# Patient Record
Sex: Female | Born: 1960 | Race: White | Hispanic: No | Marital: Married | Smoking: Never smoker
Health system: Southern US, Community
[De-identification: ages and names within clinical notes are randomized; demographics above are authoritative.]

## PROBLEM LIST (undated history)

## (undated) DIAGNOSIS — I10 Essential (primary) hypertension: Secondary | ICD-10-CM

## (undated) DIAGNOSIS — K219 Gastro-esophageal reflux disease without esophagitis: Secondary | ICD-10-CM

## (undated) DIAGNOSIS — G629 Polyneuropathy, unspecified: Secondary | ICD-10-CM

## (undated) DIAGNOSIS — E785 Hyperlipidemia, unspecified: Secondary | ICD-10-CM

## (undated) DIAGNOSIS — H269 Unspecified cataract: Secondary | ICD-10-CM

## (undated) DIAGNOSIS — R112 Nausea with vomiting, unspecified: Secondary | ICD-10-CM

## (undated) DIAGNOSIS — M1990 Unspecified osteoarthritis, unspecified site: Secondary | ICD-10-CM

## (undated) DIAGNOSIS — T8859XA Other complications of anesthesia, initial encounter: Secondary | ICD-10-CM

## (undated) HISTORY — DX: Gastro-esophageal reflux disease without esophagitis: K21.9

## (undated) HISTORY — DX: Unspecified cataract: H26.9

## (undated) HISTORY — DX: Polyneuropathy, unspecified: G62.9

## (undated) HISTORY — PX: TUBAL LIGATION: SHX77

---

## 1984-12-31 HISTORY — PX: GALLBLADDER SURGERY: SHX652

## 2011-03-26 ENCOUNTER — Emergency Department (HOSPITAL_COMMUNITY): Payer: Self-pay

## 2011-03-26 ENCOUNTER — Inpatient Hospital Stay (HOSPITAL_COMMUNITY)
Admission: EM | Admit: 2011-03-26 | Discharge: 2011-03-29 | DRG: 287 | Disposition: A | Discharge status: Home / Self Care | Payer: Self-pay | Attending: Cardiology | Admitting: Cardiology

## 2011-03-26 ENCOUNTER — Encounter (HOSPITAL_COMMUNITY): Payer: Self-pay

## 2011-03-26 DIAGNOSIS — R0789 Other chest pain: Principal | ICD-10-CM | POA: Diagnosis present

## 2011-03-26 DIAGNOSIS — E669 Obesity, unspecified: Secondary | ICD-10-CM | POA: Diagnosis present

## 2011-03-26 DIAGNOSIS — F411 Generalized anxiety disorder: Secondary | ICD-10-CM | POA: Diagnosis present

## 2011-03-26 DIAGNOSIS — M79609 Pain in unspecified limb: Secondary | ICD-10-CM | POA: Diagnosis present

## 2011-03-26 DIAGNOSIS — I10 Essential (primary) hypertension: Secondary | ICD-10-CM | POA: Diagnosis present

## 2011-03-26 HISTORY — DX: Essential (primary) hypertension: I10

## 2011-03-26 LAB — CBC
HCT: 39.1 % (ref 36.0–46.0)
Hemoglobin: 13.1 g/dL (ref 12.0–15.0)
MCHC: 33.5 g/dL (ref 30.0–36.0)
RDW: 13.3 % (ref 11.5–15.5)
WBC: 6.3 10*3/uL (ref 4.0–10.5)

## 2011-03-26 LAB — DIFFERENTIAL
Basophils Absolute: 0 10*3/uL (ref 0.0–0.1)
Basophils Relative: 0 % (ref 0–1)
Lymphocytes Relative: 47 % — ABNORMAL HIGH (ref 12–46)
Monocytes Absolute: 0.4 10*3/uL (ref 0.1–1.0)
Neutro Abs: 2.8 10*3/uL (ref 1.7–7.7)

## 2011-03-26 LAB — CK TOTAL AND CKMB (NOT AT ARMC)
CK, MB: 1.8 ng/mL (ref 0.3–4.0)
CK, MB: 1.3 ng/mL (ref 0.3–4.0)
Relative Index: 1 (ref 0.0–2.5)
Relative Index: 0.9 (ref 0.0–2.5)
Total CK: 145 U/L (ref 7–177)

## 2011-03-26 LAB — TROPONIN I
Troponin I: 0.01 ng/mL (ref 0.00–0.06)
Troponin I: <0.01 ng/mL (ref 0.00–0.06)

## 2011-03-26 LAB — BASIC METABOLIC PANEL
Calcium: 9.3 mg/dL (ref 8.4–10.5)
GFR calc Af Amer: >60 mL/min (ref >60)
GFR calc non Af Amer: >60 mL/min (ref >60)
Potassium: 4.5 mEq/L (ref 3.5–5.1)
Sodium: 139 mEq/L (ref 135–145)

## 2011-03-26 MED ORDER — IOHEXOL 350 MG/ML SOLN
100.0000 mL | Freq: Once | INTRAVENOUS | Status: AC | PRN
  Administered 2011-03-26: 100 mL via INTRAVENOUS

## 2011-03-28 ENCOUNTER — Inpatient Hospital Stay (HOSPITAL_COMMUNITY): Payer: Self-pay

## 2011-03-28 MED ORDER — TECHNETIUM TC 99M TETROFOSMIN IV KIT
30.0000 | PACK | Freq: Once | INTRAVENOUS | Status: AC | PRN
  Administered 2011-03-27: 30 via INTRAVENOUS

## 2011-03-28 MED ORDER — TECHNETIUM TC 99M TETROFOSMIN IV KIT: 30.0000 | PACK | Freq: Once | INTRAVENOUS | Status: AC | PRN

## 2011-03-29 ENCOUNTER — Other Ambulatory Visit (HOSPITAL_COMMUNITY): Payer: Self-pay

## 2011-03-29 LAB — PROTIME-INR
INR: 0.92 (ref 0.00–1.49)
Prothrombin Time: 12.6 seconds (ref 11.6–15.2)

## 2011-04-20 NOTE — Cardiovascular Report (Signed)
[name], [name]                 ACCOUNT NO.:  [id number]  MEDICAL RECORD NO.:  [id number]           PATIENT TYPE:  LOCATION:                                 FACILITY:  PHYSICIAN:  Lyn Records, M.D.   DATE OF BIRTH:  [birth year]  DATE OF PROCEDURE: DATE OF DISCHARGE:                           CARDIAC CATHETERIZATION   INDICATION FOR STUDY:  The patient has a history of sudden onset chest pain and family history of CAD (mother and father).  The chest pain had feature similar to angina and led to hospitalization.  A subsequent nuclear study after negative markers demonstrated the possibility of apical akinesis and peri-infarct ischemia.  The patient has had no recurrent chest pain since that resolved on the day of admission.  Dr. Mayford Knife has requested catheterization to define the possibility of obstructive coronary artery disease.  PROCEDURES PERFORMED: 1. Left heart catheterization. 2. Selective coronary angiography. 3. Left ventriculography. 4. Initial radial approach aborted due to brachial anatomy that     prevented advancement of the 5-French diagnostic catheter above the     elbow.  There was also significant spasm.  The case was completed     via the right femoral approach.  DESCRIPTION:  After informed consent, the patient was brought to the Catheterization Laboratory.  The right radial approach was chosen as the access site of choice.  A single anterior stick into the radial artery was performed without difficulty and placement of the radial sheath over the straight wire occurred without difficulty.  We attempted to advance a tight J exchange wire, but had resistance slightly above the exit from the catheter.  Angiography demonstrated that the radial artery was small.  There was also tortuosity noted before the brachial artery continued up the arm.  We ultimately used a Glidewire to steer ourselves into the brachial artery.  This was accomplished relatively easily.   We were unable to advance the diagnostic 5-French multipurpose catheter above the level of the elbow due to the small caliber of the vessel (radial) and other anatomic considerations with superimposed spasm.  We did not force this effort and aborted attempts to perform the procedure from the arm and went to her right femoral.  Since given heparin during the initial radial attempts, we used a Smart needle to enter the right femoral with a single anterior stick.  The sheath was placed without difficulty and the procedure performed using a 5-French A2 multipurpose catheter.  200 mcg of intracoronary nitroglycerin was administered into the left coronary artery during diagnostic angiography.  Angio-Seal was used for hemostasis.  The patient tolerated the procedure without complications.  RESULTS: 1. Hemodynamic data:     a.     Aortic pressure 131/64 mmHg.     b.     Left ventricular pressure 134/15 mmHg. 2. Left ventriculography:  The left ventricular cavity size and     function are normal.  EF is 68%. 3. Coronary angiography.     a.     Left main coronary artery:  Normal.     b.     Left anterior descending  coronary artery:  This vessel has      significant diffuse spasm on the initial images that has      significantly reduced after intracoronary nitroglycerin.  No focal      high-grade obstruction is noted within the LAD.  There is moderate      diffuse disease in the apical segment of the LAD.     c.     Circumflex artery:  The circumflex coronary artery is widely      patent.     d.     Right coronary artery:  The right coronary is widely patent.      Right coronary artery is dominant.  CONCLUSION: 1. No significant coronary atherosclerosis is noted.  Luminal     irregularities are noted in the distal LAD and in the right     coronary artery. 2. Normal left ventricular function without evidence of infarction. 3. Normal left ventricular hemodynamics. 4. Aborted right radial  approach due to tortuous anatomy and the     patient's antecubital area.  PLAN:  The patient received Angio-Seal in the right femoral cath site. She did receive 4000 units of heparin.  We will proceed with discharge later this evening if no bleeding or other complications occurred.     Lyn Records, M.D.     HWS/MEDQ  D:  03/29/2011  T:  03/30/2011  Job:  [id number]  cc:   Dr. Mitzi Davenport, M.D.  Electronically Signed by Verdis Prime M.D. on 04/20/2011 04:56:00 PM

## 2011-04-23 NOTE — Consult Note (Signed)
[name], [name]                 ACCOUNT NO.:  [id number]  MEDICAL RECORD NO.:  [id number]           PATIENT TYPE:  LOCATION:                                 FACILITY:  PHYSICIAN:  Armanda Magic, M.D.     DATE OF BIRTH:  07-08-1961  DATE OF CONSULTATION:  03/27/2011 DATE OF DISCHARGE:                                CONSULTATION   REFERRING PHYSICIAN:  [facility].  PRIMARY PHYSICIAN:  Dr. Charm Barges.  CHIEF COMPLAINT:  Chest pain.  HISTORY OF PRESENT ILLNESS:  This is a 50 year old white female with a history of hypertension and obesity who presented to the emergency room with complaints of chest pain.  She states that Saturday a.m. she developed severe pain from her left thigh to her groin.  She took some quinine water.  Throughout the day on Saturday, she became weak and her leg continued to be sore.  Monday while getting ready for work, she developed sharp pain in her left chest with radiation down her left shoulder.  About 15 minutes later, she noted she was very short of breath and then presented to the emergency room.  He was nauseated but no diaphoresis except for during her leg cramps on Saturday.  In the emergency room, she developed tingling and numbness across her chest and up into her left arm and face.  She says she could not talk correctly. In the emergency room, the physician felt she was hyperventilating.  She was given Ativan for anxiety and after 30 minutes, she felt better except for heaviness in her chest.  The pain eventually resolved after 1 hour.  She was also given nitroglycerin with no relief until 45 minutes later.  Her past medical history includes hypertension.  She is currently not on any medications.  PAST SURGICAL HISTORY:  Cholecystectomy in 1983 and tubal ligation in 1980.  SOCIAL HISTORY:  She is married, with 2 children.  She denies any tobacco or alcohol use.  Her medications are none.  FAMILY HISTORY:  Her mother is 43 with  CAD and an MI at 92.  Her father died of an MI at 48.  She has one brother and two sisters, none of which have CAD.  She has one sister with PVD and DVT.  Review of systems is negative except for HPI.  PHYSICAL EXAMINATION:  VITAL SIGNS:  Blood pressure is 141/71, heart rate IS 66, O2 saturations is 98% on room air. GENERAL:  This is a well-developed obese white female in no acute distress. HEENT:  Benign. NECK:  Supple without lymphadenopathy.  Carotid upstrokes are +2 bilaterally.  No bruits. LUNGS:  Clear to auscultation throughout. HEART:  Regular rate and rhythm.  No murmurs, rubs, or gallops. ABDOMEN:  Soft, nontender, nondistended with normoactive bowel sounds. No hepatosplenomegaly. EXTREMITIES:  No cyanosis, erythema, or edema.  Dorsalis pedis pulses +2 bilaterally.  LABORATORY DATA:  Sodium 139, potassium 4.5, chloride 106, bicarb 26, BUN 12, creatinine 0.92, white cell count 6.3, hemoglobin 13.1, hematocrit 39.1, platelet count 193.  CPKs are 174, 134, 145.  MB 1.8, 1.3, 1.3.  Troponin is  less than 0.01 x2 and 0.01.  Chest CT was negative for PE.  EKG shows sinus rhythm with LVH and left anterior fascicular block.  ASSESSMENT: 1. Atypical and typical chest pain with sharp pain along with a     pressure lasting 90 minutes.  Cardiac enzymes are negative x3     despite prolonged chest pain.  Chest CT is negative for PE.  EKG is     nonischemic.  Currently, she is pain-free.  Cardiac risk factors     include obesity and a family history of coronary disease at a later     age in life. 2. Obesity. 3. Chronic lower extremity pain. 4. Elevated D-dimer with negative CT for PE.  PLAN:  The patient was set up for a stress echo in the ER, but she had no IV access so stress echo was cancelled.  She needs a nonischemic workup and would like to go home.  She is self-pay there though and therefore we will keep in the hospital and get a 2-day nuclear stress test.  We will have the  rest images done today and then stress images in the morning.     Armanda Magic, M.D.     TT/MEDQ  D:  04/03/2011  T:  04/04/2011  Job:  [id number]  cc:   Dr. Charm Barges  Electronically Signed by Armanda Magic M.D. on 04/23/2011 07:43:24 PM

## 2011-09-18 IMAGING — CT CT ANGIO CHEST
2 of 6 series · 19 of 36 positions shown · IV contrast (APPLIED)
Comparison: none

CLINICAL DATA: Chest pain, pain radiating down left neck and left
arm with shortness of breath.  Concern pulmonary embolism.

CT ANGIOGRAPHY CHEST WITH CONTRAST
TECHNIQUE: Multidetector CT imaging of the chest was performed
using the standard protocol during bolus administration of
intravenous contrast.  Multiplanar CT image reconstructions
including MIPs were obtained to evaluate the vascular anatomy.
Contrast:  100 ml Omnipaque 300

[Series 8: pulm embolism 1.0 b25f thins · axial · 0.74mm/px · z∈[-242,+4]mm · 18 of 273 slices shown]
[im 14/273  lung]
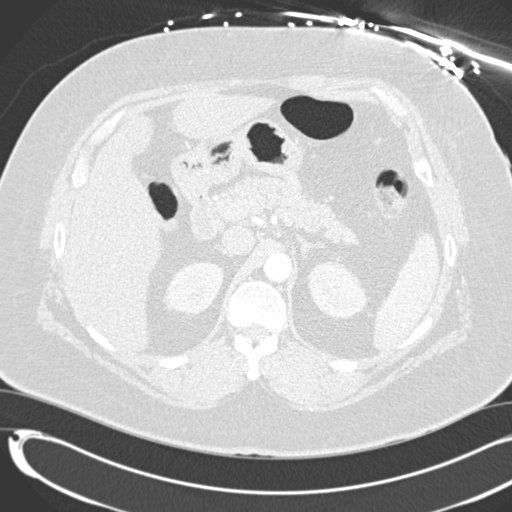
[im 28/273  mediastinal]
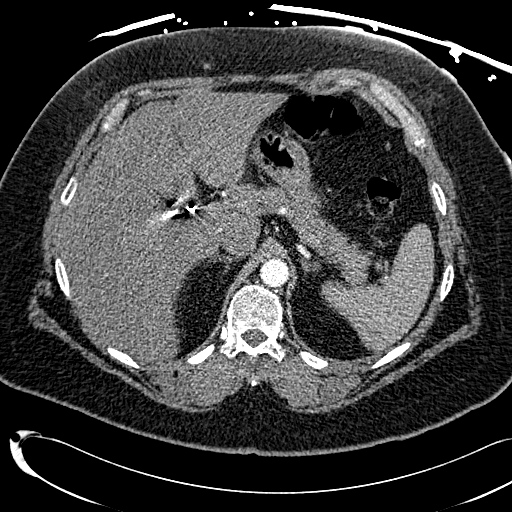
[im 41/273  lung]
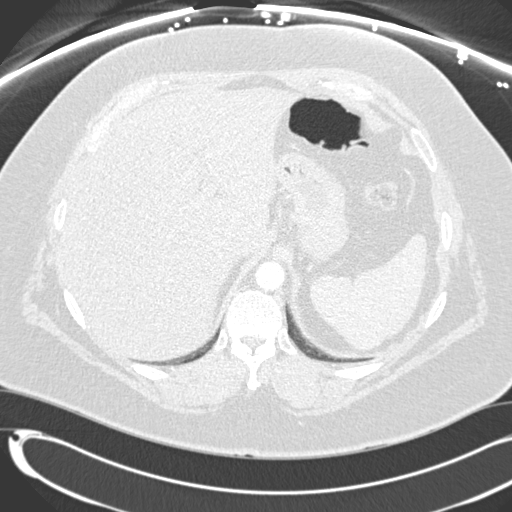
[im 55/273  mediastinal]
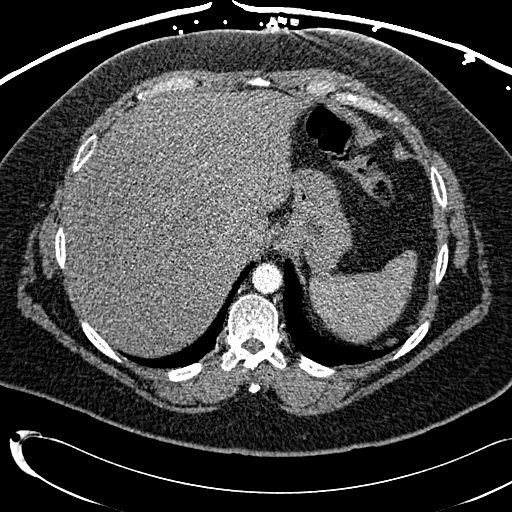
[im 69/273  lung]
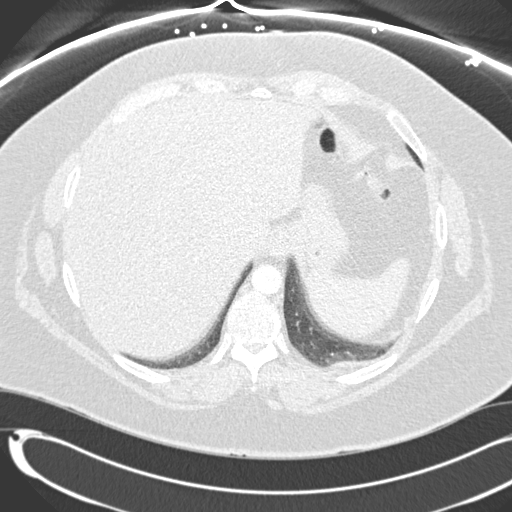
[im 82/273  mediastinal]
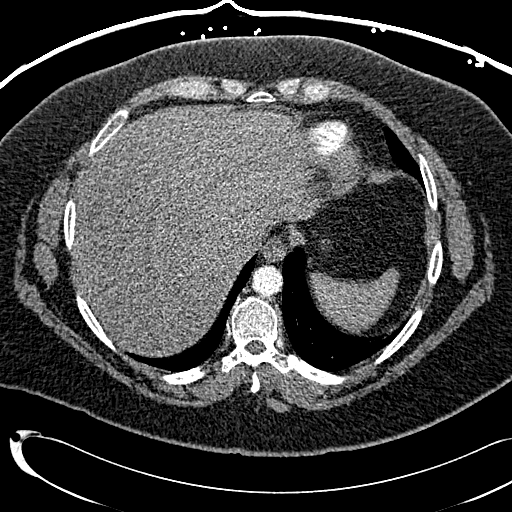
[im 96/273  lung]
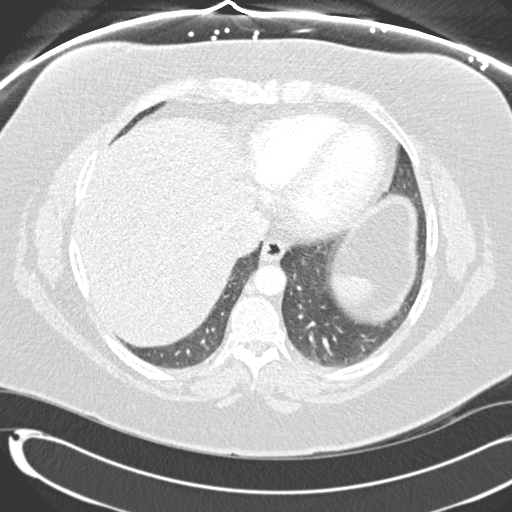
[im 109/273  mediastinal]
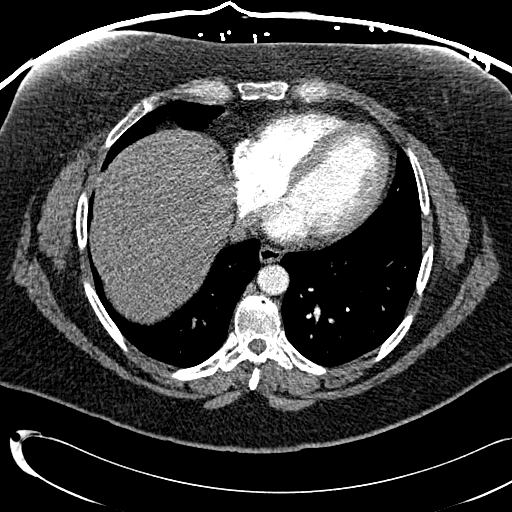
[im 123/273  lung]
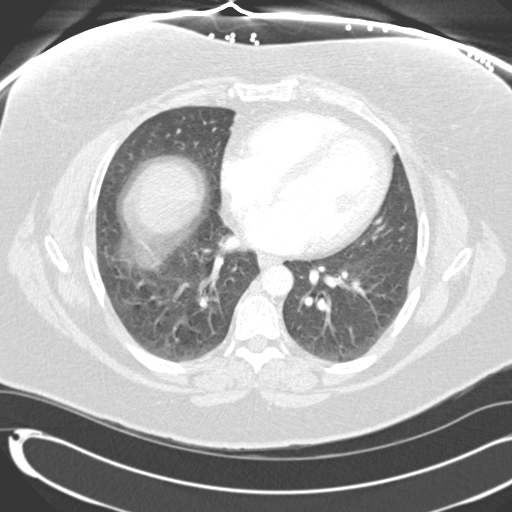
[im 150/273  mediastinal]
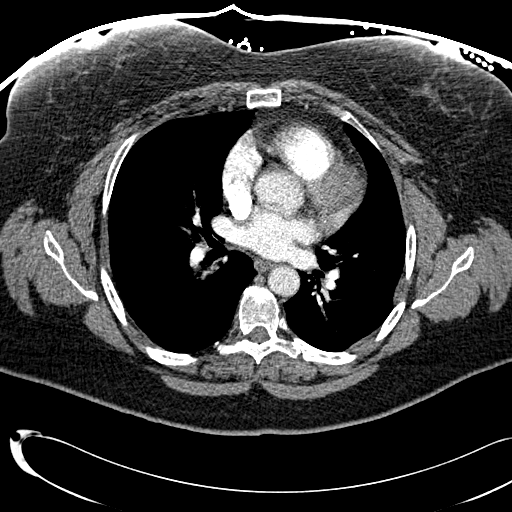
[im 164/273  lung]
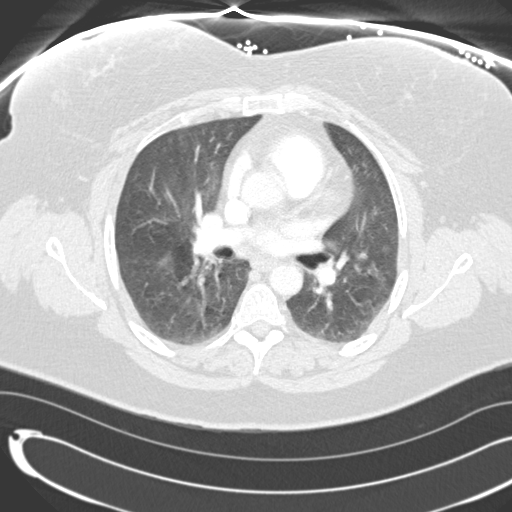
[im 177/273  mediastinal]
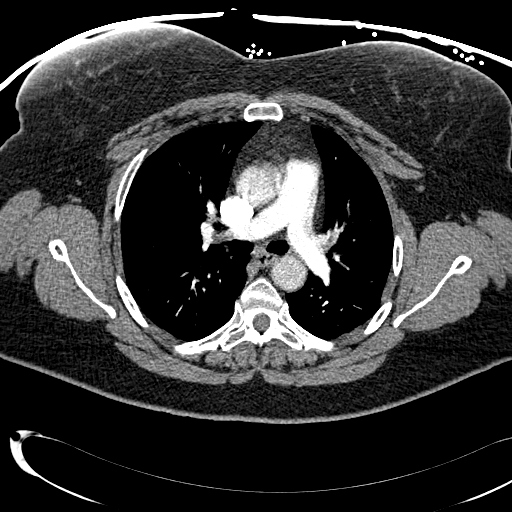
[im 191/273  lung]
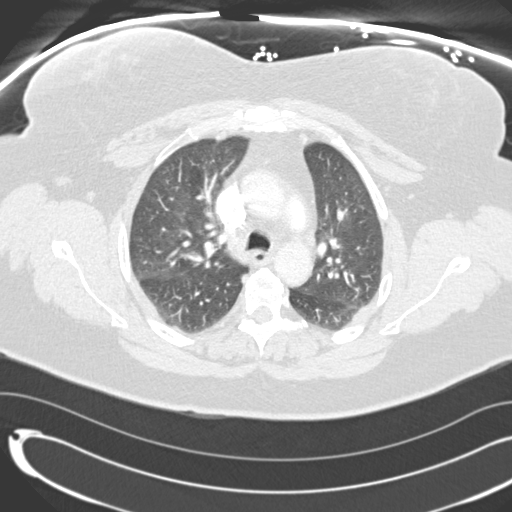
[im 205/273  mediastinal]
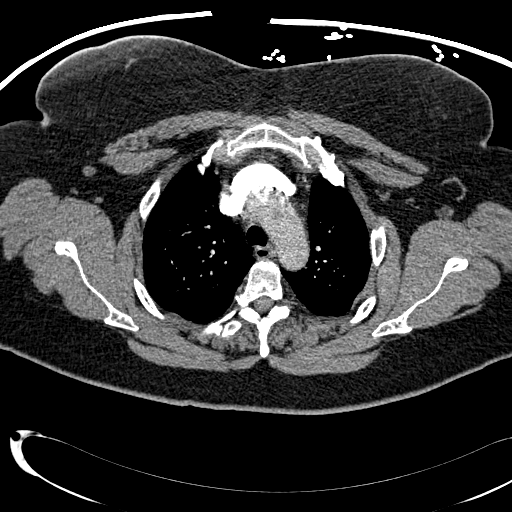
[im 218/273  lung]
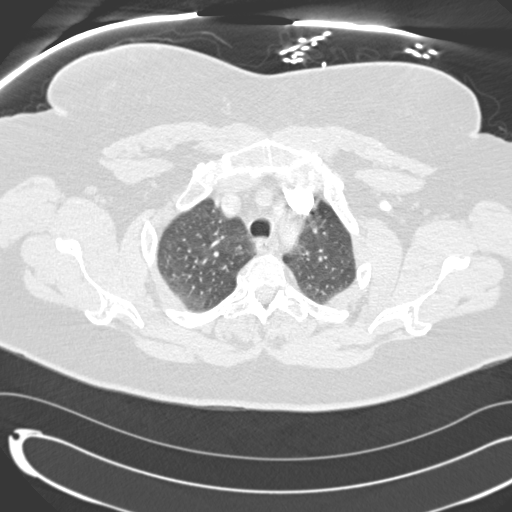
[im 232/273  mediastinal]
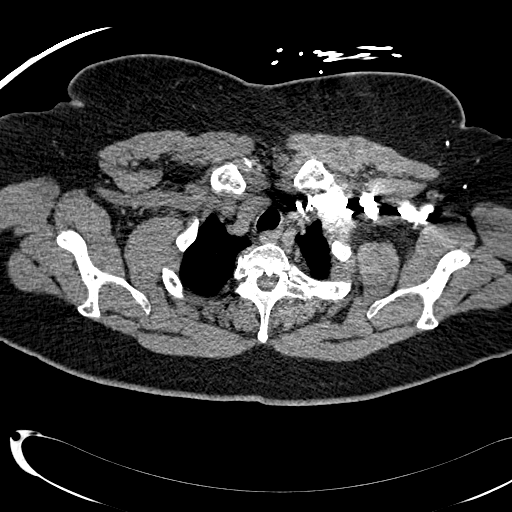
[im 245/273  lung]
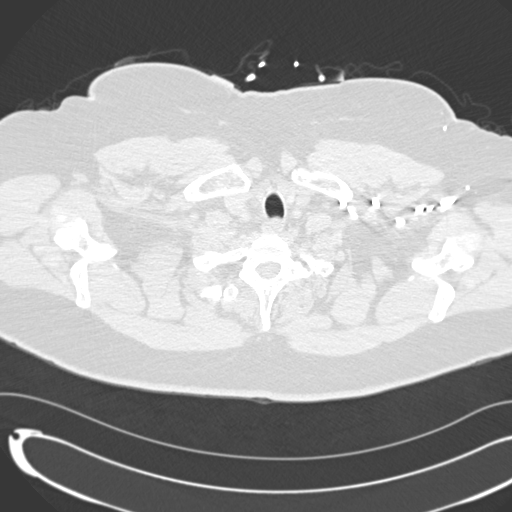
[im 259/273  mediastinal]
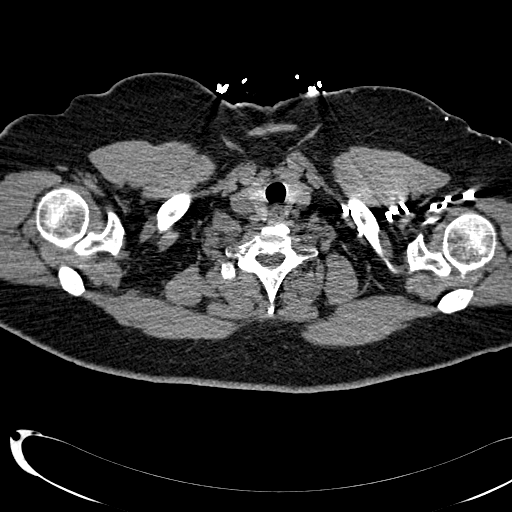

[Series 604: coronal · coronal · 0.74mm/px · 1 of 80 slices shown]
[im 40/80  mediastinal]
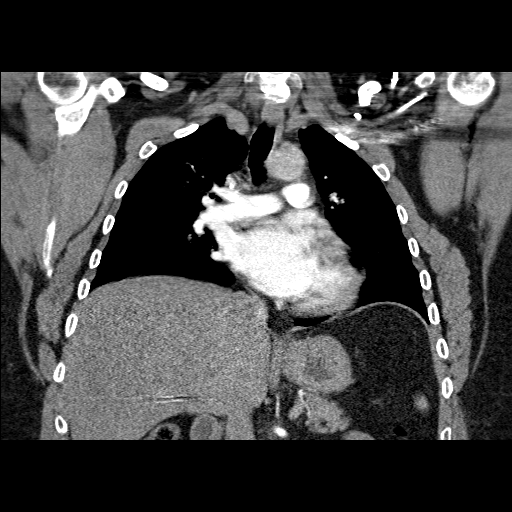

[19 of 36 positions shown; findings below may reference images not displayed]

FINDINGS: No filling defects within the pulmonary arteries to
suggest acute pulmonary embolism.  No acute findings of the aorta
or great vessels.  No pericardial fluid.  The esophagus is normal.

Review of the lung parenchyma demonstrates no effusion,
consolidation, or pneumothorax.  There is respiratory degradation
on the images.  The airways appear normal.

No evidence of axillary supraclavicular lymphadenopathy.  No
mediastinal or hilar lymphadenopathy.

Limited view of the upper abdomen is unremarkable.  There are
cholecystectomy clips in the right upper quadrant.

Review of the MIP images confirms the above findings.
IMPRESSION: 1.  No evidence acute pulmonary embolism.
2.  No acute pulmonary parenchymal abnormality.

## 2013-08-11 ENCOUNTER — Encounter: Payer: Self-pay | Admitting: Diagnostic Neuroimaging

## 2013-08-11 ENCOUNTER — Ambulatory Visit (INDEPENDENT_AMBULATORY_CARE_PROVIDER_SITE_OTHER): Payer: BC Managed Care – PPO | Admitting: Diagnostic Neuroimaging

## 2013-08-11 VITALS — BP 158/86 | HR 76 | Ht 66.0 in | Wt 274.0 lb

## 2013-08-11 DIAGNOSIS — M79606 Pain in leg, unspecified: Secondary | ICD-10-CM | POA: Insufficient documentation

## 2013-08-11 DIAGNOSIS — M79605 Pain in left leg: Secondary | ICD-10-CM

## 2013-08-11 DIAGNOSIS — R200 Anesthesia of skin: Secondary | ICD-10-CM | POA: Insufficient documentation

## 2013-08-11 DIAGNOSIS — M545 Low back pain: Secondary | ICD-10-CM

## 2013-08-11 DIAGNOSIS — M79609 Pain in unspecified limb: Secondary | ICD-10-CM

## 2013-08-11 DIAGNOSIS — R209 Unspecified disturbances of skin sensation: Secondary | ICD-10-CM

## 2013-08-11 MED ORDER — GABAPENTIN 300 MG PO CAPS: 300.0000 mg | ORAL_CAPSULE | Freq: Three times a day (TID) | ORAL | Status: DC

## 2013-08-11 NOTE — Progress Notes (Signed)
[facility]  PATIENT: [name] DOB: 25-Nov-1961  REFERRING CLINICIAN: Nyland HISTORY FROM: patient REASON FOR VISIT: new consult   HISTORICAL  CHIEF COMPLAINT:  Chief Complaint  Patient presents with  . Pain    L leg, cramps, burning, tingling, and numbness    HISTORY OF PRESENT ILLNESS:   52 year old right-handed female here for evaluation of bilateral lower extremity pain.  3-6 months ago patient developed cramps and muscle pain in her left leg greater than right leg, radiating up to her groin. Symptoms were intermittent. She tried over-the-counter muscle cramps and restless leg syndrome medications. These did not help.  Patient also developed burning sensation in her left lower leg, laterally between her ankle and knee. She also developed left heel pain. Symptoms have been increasingly painful over the past 2 months.  Patient also has long-standing low back pain, bilateral hip pain.  Patient also noted as a salmon-colored scaling rash on her left dorsal wrist region, which concerned her for possibility of psoriasis. Patient's brother has psoriasis as a diagnosis. Around the same time patient noticed this rash, her left leg symptoms began to intensify.  REVIEW OF SYSTEMS: Full 14 system review of systems performed and notable only for fevers and chills, fatigue, rash, cramps, numbness, weakness, restless legs.  ALLERGIES: No Known Allergies  HOME MEDICATIONS: Prior to Admission medications   Medication Sig Start Date End Date Taking? Authorizing Provider  omeprazole (PRILOSEC) 40 MG capsule Take 40 mg by mouth daily.   Yes Historical Provider, MD  gabapentin (NEURONTIN) 300 MG capsule Take 1 capsule (300 mg total) by mouth 3 (three) times daily. 08/11/13   Suanne Marker, MD   No outpatient prescriptions prior to visit.   No facility-administered medications prior to visit.    PAST MEDICAL HISTORY: Past Medical History  Diagnosis Date  .  Hypertension   . Acid reflux   . Neuropathy     PAST SURGICAL HISTORY: Past Surgical History  Procedure Laterality Date  . Gallbladder surgery  1986  . Tubal ligation      FAMILY HISTORY: Family History  Problem Relation Age of Onset  . Heart attack Father     SOCIAL HISTORY:  History   Social History  . Marital Status: Married    Spouse Name: Nedra Hai    Number of Children: 2  . Years of Education: College   Occupational History  .  [employer]  .      Unemployed   Social History Main Topics  . Smoking status: Never Smoker   . Smokeless tobacco: Never Used  . Alcohol Use: No  . Drug Use: No  . Sexually Active: Not on file   Other Topics Concern  . Not on file   Social History Narrative   Patient lives at home with spouse.   Caffeine Use: 2 cups daily     PHYSICAL EXAM  Filed Vitals:   08/11/13 0812  BP: 158/86  Pulse: 76  Height: 5\' 6"  (1.676 m)  Weight: 274 lb (124.286 kg)    Not recorded    Body mass index is 44.25 kg/(m^2).  GENERAL EXAM: Patient is in no distress; POSITIVE STRAIGHT LEG RAISE ON LEFT LEG.  CARDIOVASCULAR: Regular rate and rhythm, no murmurs, no carotid bruits  NEUROLOGIC: MENTAL STATUS: awake, alert, language fluent, comprehension intact, naming intact CRANIAL NERVE: no papilledema on fundoscopic exam, pupils equal and reactive to light, visual fields full to confrontation, extraocular muscles intact, no nystagmus, facial sensation and strength  symmetric, uvula midline, shoulder shrug symmetric, tongue midline. MOTOR: normal bulk and tone, full strength in the BUE, BLE SENSORY: normal and symmetric to light touch and proprioception; DECR TO VIB, TEMP, IN FEET. RIGHT TOE VIB 6 SEC, LEFT TOE VIB 9 SEC.  COORDINATION: finger-nose-finger, fine finger movements normal REFLEXES: BUE TRACE, KNEES 1, RIGHT ANKLE 1, LEFT ANKLE 0. DOWN GOING TOES. GAIT/STATION: narrow based gait; PAINFUL IN LEFT HEEL WITH TOE AND  HEEL GAIT. Romberg is  negative   DIAGNOSTIC DATA (LABS, IMAGING, TESTING) - I reviewed patient records, labs, notes, testing and imaging myself where available.  Lab Results  Component Value Date   WBC 6.3 03/26/2011   HGB 13.1 03/26/2011   HCT 39.1 03/26/2011   MCV 85.9 03/26/2011   PLT 193 03/26/2011      Component Value Date/Time   NA 139 03/26/2011 1015   K 4.5 03/26/2011 1015   CL 106 03/26/2011 1015   CO2 26 03/26/2011 1015   GLUCOSE 100* 03/26/2011 1015   BUN 12 03/26/2011 1015   CREATININE 0.92 03/26/2011 1015   CALCIUM 9.3 03/26/2011 1015   GFRNONAA >60 03/26/2011 1015   GFRAA  Value: >60        The eGFR has been calculated using the MDRD equation. This calculation has not been validated in all clinical situations. eGFR's persistently <60 mL/min signify possible Chronic Kidney Disease. 03/26/2011 1015   No results found for this basename: CHOL, HDL, LDLCALC, LDLDIRECT, TRIG, CHOLHDL   No results found for this basename: HGBA1C   No results found for this basename: VITAMINB12   No results found for this basename: TSH     ASSESSMENT AND PLAN  52 y.o. year old female here with 3-6 month history of lower extremity pain, burning, cramps, left worse than right leg. Also with low back pain.  Ddx: Lumbar radiculopathy, peripheral neuropathy, psoriatic arthritis  PLAN: 1. MRI lumbar spine 2. Trial of gabapentin 3. In future may consider physical therapy, EMG nerve conduction, rheumatology evaluation 4. Ask PCP if TSH, B12, A1c have been checked   Orders Placed This Encounter  Procedures  . MR Lumbar Spine Wo Contrast    Meds ordered this encounter  Medications  . omeprazole (PRILOSEC) 40 MG capsule    Sig: Take 40 mg by mouth daily.  Marland Kitchen gabapentin (NEURONTIN) 300 MG capsule    Sig: Take 1 capsule (300 mg total) by mouth 3 (three) times daily.    Dispense:  90 capsule    Refill:  6    Return in about 3 months (around 11/11/2013) for with Heide Guile or Penumalli.    Suanne Marker, MD  08/11/2013, 9:01 AM Certified in Neurology, Neurophysiology and Neuroimaging  [facility] [address], [address] [place], [place] [address] [phone]

## 2013-08-11 NOTE — Patient Instructions (Signed)
Try gabapentin 300 mg at bedtime. May increase to twice a day or 3 times a day as tolerated.  I will check MRI of the lumbar spine (low back).

## 2013-08-19 ENCOUNTER — Other Ambulatory Visit: Payer: BC Managed Care – PPO

## 2013-08-21 ENCOUNTER — Ambulatory Visit: Payer: BC Managed Care – PPO | Admitting: Neurology

## 2013-08-26 ENCOUNTER — Ambulatory Visit (INDEPENDENT_AMBULATORY_CARE_PROVIDER_SITE_OTHER): Payer: BC Managed Care – PPO

## 2013-08-26 DIAGNOSIS — M545 Low back pain: Secondary | ICD-10-CM

## 2013-08-26 DIAGNOSIS — M79605 Pain in left leg: Secondary | ICD-10-CM

## 2013-08-26 DIAGNOSIS — M79609 Pain in unspecified limb: Secondary | ICD-10-CM

## 2013-08-26 DIAGNOSIS — R209 Unspecified disturbances of skin sensation: Secondary | ICD-10-CM

## 2013-08-26 DIAGNOSIS — R200 Anesthesia of skin: Secondary | ICD-10-CM

## 2013-09-01 ENCOUNTER — Telehealth: Payer: Self-pay | Admitting: Diagnostic Neuroimaging

## 2013-09-03 ENCOUNTER — Telehealth: Payer: Self-pay | Admitting: *Deleted

## 2013-09-03 DIAGNOSIS — M545 Low back pain: Secondary | ICD-10-CM

## 2013-09-03 NOTE — Telephone Encounter (Signed)
[phone] please call with MRI results i've already checked and the final result is available. She has been waiting for over a week and is going out of town.

## 2013-09-03 NOTE — Telephone Encounter (Signed)
Patient requesting MRI results

## 2013-09-04 NOTE — Telephone Encounter (Signed)
I called pt. Tried gabapentin --> disoriented. I reviewed MRI results. Offered neurosurg eval vs. pain mgmt clinic vs. PT. She wants to proceed with pain mgmt clinic.    Suanne Marker, MD 09/04/2013, 5:58 PM Certified in Neurology, Neurophysiology and Neuroimaging  [facility] [address] [place], [place] [address] [phone]

## 2013-11-04 NOTE — Telephone Encounter (Signed)
Done

## 2013-11-11 ENCOUNTER — Ambulatory Visit: Payer: BC Managed Care – PPO | Admitting: Nurse Practitioner

## 2020-11-08 ENCOUNTER — Ambulatory Visit (HOSPITAL_COMMUNITY): Payer: Commercial Managed Care - PPO | Admitting: Physical Therapy

## 2020-11-08 ENCOUNTER — Encounter (HOSPITAL_COMMUNITY): Payer: Self-pay

## 2022-01-08 DIAGNOSIS — H35371 Puckering of macula, right eye: Secondary | ICD-10-CM | POA: Diagnosis not present

## 2022-01-08 DIAGNOSIS — H35341 Macular cyst, hole, or pseudohole, right eye: Secondary | ICD-10-CM | POA: Diagnosis not present

## 2022-01-08 DIAGNOSIS — H25813 Combined forms of age-related cataract, bilateral: Secondary | ICD-10-CM | POA: Diagnosis not present

## 2022-01-08 DIAGNOSIS — H53411 Scotoma involving central area, right eye: Secondary | ICD-10-CM | POA: Diagnosis not present

## 2022-01-08 DIAGNOSIS — H5315 Visual distortions of shape and size: Secondary | ICD-10-CM | POA: Diagnosis not present

## 2022-01-08 DIAGNOSIS — H43821 Vitreomacular adhesion, right eye: Secondary | ICD-10-CM | POA: Diagnosis not present

## 2022-01-15 ENCOUNTER — Other Ambulatory Visit: Payer: Self-pay

## 2022-01-15 ENCOUNTER — Encounter (HOSPITAL_COMMUNITY): Payer: Self-pay | Admitting: Ophthalmology

## 2022-01-15 NOTE — Progress Notes (Addendum)
Mrs Gingras denies chest pain or shortness of breath. Patient denies having any s/s of Covid in her household.  Patient denies any known exposure to Covid.   I instructed Mrs. Reddix to stop Mobic today and to not use herbal medication  or take Vitamin until Dr. Allena Katz says it it ok.  I instructed Mrs Golonka to shower with antibiotic soap, if it is available.  Dry off with a clean towel. Do not put lotion, powder, cologne or deodorant or makeup.No jewelry or piercings. Men may shave their face and neck. Woman should not shave. No nail polish, artificial or acrylic nails. Wear clean clothes, brush your teeth. Glasses, contact lens,dentures or partials may not be worn in the OR. If you need to wear them, please bring a case for glasses, do not wear contacts or bring a case, the hospital does not have contact cases, dentures or partials will have to be removed , make sure they are clean, we will provide a denture cup to put them in. You will need some one to drive you home and a responsible person over the age of 68 to stay with you for the first 24 hours after surgery.

## 2022-01-16 ENCOUNTER — Encounter (HOSPITAL_COMMUNITY)
Admission: RE | Disposition: A | Discharge status: Home / Self Care | Payer: Self-pay | Source: Home / Self Care | Attending: Ophthalmology

## 2022-01-16 ENCOUNTER — Ambulatory Visit (HOSPITAL_COMMUNITY): Payer: 59 | Admitting: Certified Registered Nurse Anesthetist

## 2022-01-16 ENCOUNTER — Ambulatory Visit (HOSPITAL_COMMUNITY)
Admission: RE | Admit: 2022-01-16 | Discharge: 2022-01-16 | Disposition: A | Discharge status: Home / Self Care | Payer: 59 | Attending: Ophthalmology | Admitting: Ophthalmology

## 2022-01-16 ENCOUNTER — Encounter (HOSPITAL_COMMUNITY): Payer: Self-pay | Admitting: Ophthalmology

## 2022-01-16 ENCOUNTER — Other Ambulatory Visit: Payer: Self-pay

## 2022-01-16 DIAGNOSIS — I10 Essential (primary) hypertension: Secondary | ICD-10-CM | POA: Diagnosis not present

## 2022-01-16 DIAGNOSIS — Z6841 Body Mass Index (BMI) 40.0 and over, adult: Secondary | ICD-10-CM | POA: Insufficient documentation

## 2022-01-16 DIAGNOSIS — H35341 Macular cyst, hole, or pseudohole, right eye: Secondary | ICD-10-CM | POA: Diagnosis not present

## 2022-01-16 HISTORY — DX: Unspecified osteoarthritis, unspecified site: M19.90

## 2022-01-16 HISTORY — DX: Nausea with vomiting, unspecified: R11.2

## 2022-01-16 HISTORY — PX: PHOTOCOAGULATION WITH LASER: SHX6027

## 2022-01-16 HISTORY — DX: Hyperlipidemia, unspecified: E78.5

## 2022-01-16 HISTORY — DX: Other complications of anesthesia, initial encounter: T88.59XA

## 2022-01-16 HISTORY — PX: GAS/FLUID EXCHANGE: SHX5334

## 2022-01-16 HISTORY — PX: PARS PLANA VITRECTOMY: SHX2166

## 2022-01-16 HISTORY — PX: MEMBRANE PEEL: SHX5967

## 2022-01-16 LAB — BASIC METABOLIC PANEL
Anion gap: 11 (ref 5–15)
BUN: 15 mg/dL (ref 8–23)
CO2: 25 mmol/L (ref 22–32)
Calcium: 8.8 mg/dL — ABNORMAL LOW (ref 8.9–10.3)
Chloride: 105 mmol/L (ref 98–111)
Creatinine, Ser: 1.07 mg/dL — ABNORMAL HIGH (ref 0.44–1.00)
GFR, Estimated: 59 mL/min — ABNORMAL LOW (ref >60)
Glucose, Bld: 97 mg/dL (ref 70–99)
Potassium: 4 mmol/L (ref 3.5–5.1)
Sodium: 141 mmol/L (ref 135–145)

## 2022-01-16 LAB — CBC
HCT: 42.4 % (ref 36.0–46.0)
Hemoglobin: 13.4 g/dL (ref 12.0–15.0)
MCH: 28.7 pg (ref 26.0–34.0)
MCHC: 31.6 g/dL (ref 30.0–36.0)
MCV: 90.8 fL (ref 80.0–100.0)
Platelets: 207 10*3/uL (ref 150–400)
RBC: 4.67 MIL/uL (ref 3.87–5.11)
RDW: 13.2 % (ref 11.5–15.5)
WBC: 7.4 10*3/uL (ref 4.0–10.5)
nRBC: 0 % (ref 0.0–0.2)

## 2022-01-16 SURGERY — PARS PLANA VITRECTOMY WITH 25 GAUGE
Anesthesia: Monitor Anesthesia Care | Site: Eye | Laterality: Right

## 2022-01-16 MED ORDER — KETOROLAC TROMETHAMINE 30 MG/ML IJ SOLN
30.0000 mg | Freq: Once | INTRAMUSCULAR | Status: AC
  Administered 2022-01-16: 30 mg via INTRAVENOUS

## 2022-01-16 MED ORDER — CEFAZOLIN SUBCONJUNCTIVAL INJECTION 100 MG/0.5 ML
INJECTION | SUBCONJUNCTIVAL | Status: DC | PRN
  Administered 2022-01-16: 50 mg via SUBCONJUNCTIVAL

## 2022-01-16 MED ORDER — TOBRAMYCIN-DEXAMETHASONE 0.3-0.1 % OP OINT
TOPICAL_OINTMENT | OPHTHALMIC | Status: DC | PRN
Start: 2022-01-16 — End: 2022-01-16
  Administered 2022-01-16: 1 via OPHTHALMIC

## 2022-01-16 MED ORDER — OXYCODONE HCL 5 MG/5ML PO SOLN: 5.0000 mg | Freq: Once | ORAL | Status: DC | PRN

## 2022-01-16 MED ORDER — AMISULPRIDE (ANTIEMETIC) 5 MG/2ML IV SOLN: 10.0000 mg | Freq: Once | INTRAVENOUS | Status: DC | PRN

## 2022-01-16 MED ORDER — EPINEPHRINE PF 1 MG/ML IJ SOLN
INTRAOCULAR | Status: DC | PRN
  Administered 2022-01-16: 500.03 mL

## 2022-01-16 MED ORDER — NA CHONDROIT SULF-NA HYALURON 40-30 MG/ML IO SOSY
INTRAOCULAR | Status: AC
  Filled 2022-01-16: qty 0.5

## 2022-01-16 MED ORDER — DEXAMETHASONE SODIUM PHOSPHATE 4 MG/ML IJ SOLN
INTRAMUSCULAR | Status: DC | PRN
  Administered 2022-01-16: 8 mg via INTRAVENOUS

## 2022-01-16 MED ORDER — PHENYLEPHRINE HCL 2.5 % OP SOLN
1.0000 [drp] | OPHTHALMIC | Status: AC | PRN
  Administered 2022-01-16 (×3): 1 [drp] via OPHTHALMIC
  Filled 2022-01-16: qty 2

## 2022-01-16 MED ORDER — TRIAMCINOLONE ACETONIDE 40 MG/ML IJ SUSP
INTRAMUSCULAR | Status: AC
  Filled 2022-01-16: qty 5

## 2022-01-16 MED ORDER — CHLORHEXIDINE GLUCONATE 0.12 % MT SOLN
15.0000 mL | Freq: Once | OROMUCOSAL | Status: AC
  Administered 2022-01-16: 15 mL via OROMUCOSAL
  Filled 2022-01-16: qty 15

## 2022-01-16 MED ORDER — PROPARACAINE HCL 0.5 % OP SOLN
1.0000 [drp] | OPHTHALMIC | Status: AC | PRN
  Administered 2022-01-16 (×3): 1 [drp] via OPHTHALMIC
  Filled 2022-01-16: qty 15

## 2022-01-16 MED ORDER — BSS IO SOLN
INTRAOCULAR | Status: AC
  Filled 2022-01-16: qty 15

## 2022-01-16 MED ORDER — DEXMEDETOMIDINE (PRECEDEX) IN NS 20 MCG/5ML (4 MCG/ML) IV SYRINGE
PREFILLED_SYRINGE | INTRAVENOUS | Status: AC
  Filled 2022-01-16: qty 15

## 2022-01-16 MED ORDER — NA CHONDROIT SULF-NA HYALURON 40-30 MG/ML IO SOSY
INTRAOCULAR | Status: DC | PRN
  Administered 2022-01-16: 0.5 mL via INTRAOCULAR

## 2022-01-16 MED ORDER — MIDAZOLAM HCL 2 MG/2ML IJ SOLN
INTRAMUSCULAR | Status: DC | PRN
  Administered 2022-01-16: 1 mg via INTRAVENOUS

## 2022-01-16 MED ORDER — EPINEPHRINE PF 1 MG/ML IJ SOLN
INTRAMUSCULAR | Status: AC
  Filled 2022-01-16: qty 1

## 2022-01-16 MED ORDER — STERILE WATER FOR IRRIGATION IR SOLN
Status: DC | PRN
  Administered 2022-01-16: 1000 mL

## 2022-01-16 MED ORDER — TETRACAINE HCL 0.5 % OP SOLN
OPHTHALMIC | Status: AC
  Filled 2022-01-16: qty 4

## 2022-01-16 MED ORDER — HYALURONIDASE HUMAN 150 UNIT/ML IJ SOLN
INTRAMUSCULAR | Status: AC
  Filled 2022-01-16: qty 1

## 2022-01-16 MED ORDER — LIDOCAINE HCL (PF) 2 % IJ SOLN
INTRAMUSCULAR | Status: AC
  Filled 2022-01-16: qty 10

## 2022-01-16 MED ORDER — BRILLIANT BLUE G 0.025 % IO SOSY
0.5000 mL | PREFILLED_SYRINGE | INTRAOCULAR | Status: DC
  Filled 2022-01-16: qty 0.5

## 2022-01-16 MED ORDER — KETOROLAC TROMETHAMINE 30 MG/ML IJ SOLN
INTRAMUSCULAR | Status: AC
  Filled 2022-01-16: qty 1

## 2022-01-16 MED ORDER — ACETAMINOPHEN 10 MG/ML IV SOLN
1000.0000 mg | Freq: Once | INTRAVENOUS | Status: DC | PRN
  Administered 2022-01-16: 1000 mg via INTRAVENOUS

## 2022-01-16 MED ORDER — OFLOXACIN 0.3 % OP SOLN
1.0000 [drp] | OPHTHALMIC | Status: AC | PRN
  Administered 2022-01-16 (×3): 1 [drp] via OPHTHALMIC
  Filled 2022-01-16: qty 5

## 2022-01-16 MED ORDER — PROPOFOL 10 MG/ML IV BOLUS
INTRAVENOUS | Status: DC | PRN
  Administered 2022-01-16: 50 mg via INTRAVENOUS

## 2022-01-16 MED ORDER — LIDOCAINE HCL 2 % IJ SOLN
INTRAMUSCULAR | Status: DC | PRN
  Administered 2022-01-16: 6 mL via RETROBULBAR

## 2022-01-16 MED ORDER — ACETAMINOPHEN 10 MG/ML IV SOLN
INTRAVENOUS | Status: AC
  Filled 2022-01-16: qty 100

## 2022-01-16 MED ORDER — FENTANYL CITRATE (PF) 100 MCG/2ML IJ SOLN: 25.0000 ug | INTRAMUSCULAR | Status: DC | PRN

## 2022-01-16 MED ORDER — LACTATED RINGERS IV SOLN: INTRAVENOUS | Status: DC

## 2022-01-16 MED ORDER — LIDOCAINE HCL 2 % IJ SOLN
INTRAMUSCULAR | Status: AC
  Filled 2022-01-16: qty 20

## 2022-01-16 MED ORDER — ONDANSETRON HCL 4 MG/2ML IJ SOLN
INTRAMUSCULAR | Status: AC
  Filled 2022-01-16: qty 2

## 2022-01-16 MED ORDER — TROPICAMIDE 1 % OP SOLN
1.0000 [drp] | OPHTHALMIC | Status: AC | PRN
  Administered 2022-01-16 (×3): 1 [drp] via OPHTHALMIC
  Filled 2022-01-16: qty 15

## 2022-01-16 MED ORDER — BUPIVACAINE HCL (PF) 0.75 % IJ SOLN
INTRAMUSCULAR | Status: AC
  Filled 2022-01-16: qty 10

## 2022-01-16 MED ORDER — OXYCODONE HCL 5 MG PO TABS: 5.0000 mg | ORAL_TABLET | Freq: Once | ORAL | Status: DC | PRN

## 2022-01-16 MED ORDER — SODIUM CHLORIDE 0.9 % IV SOLN: INTRAVENOUS | Status: DC | PRN

## 2022-01-16 MED ORDER — CEFAZOLIN SUBCONJUNCTIVAL INJECTION 100 MG/0.5 ML
100.0000 mg | INJECTION | SUBCONJUNCTIVAL | Status: DC
  Filled 2022-01-16: qty 5

## 2022-01-16 MED ORDER — FENTANYL CITRATE (PF) 250 MCG/5ML IJ SOLN
INTRAMUSCULAR | Status: DC | PRN
  Administered 2022-01-16: 50 ug via INTRAVENOUS

## 2022-01-16 MED ORDER — PROMETHAZINE HCL 25 MG/ML IJ SOLN: 6.2500 mg | INTRAMUSCULAR | Status: DC | PRN

## 2022-01-16 MED ORDER — ONDANSETRON HCL 4 MG/2ML IJ SOLN
INTRAMUSCULAR | Status: DC | PRN
  Administered 2022-01-16: 4 mg via INTRAVENOUS

## 2022-01-16 MED ORDER — DEXAMETHASONE SODIUM PHOSPHATE 10 MG/ML IJ SOLN
INTRAMUSCULAR | Status: DC | PRN
  Administered 2022-01-16: 10 mg

## 2022-01-16 MED ORDER — DEXAMETHASONE SODIUM PHOSPHATE 10 MG/ML IJ SOLN
INTRAMUSCULAR | Status: AC
  Filled 2022-01-16: qty 1

## 2022-01-16 MED ORDER — ORAL CARE MOUTH RINSE: 15.0000 mL | Freq: Once | OROMUCOSAL | Status: AC

## 2022-01-16 MED ORDER — BSS PLUS IO SOLN
INTRAOCULAR | Status: AC
  Filled 2022-01-16: qty 500

## 2022-01-16 MED ORDER — TOBRAMYCIN-DEXAMETHASONE 0.3-0.1 % OP OINT
TOPICAL_OINTMENT | OPHTHALMIC | Status: AC
  Filled 2022-01-16: qty 3.5

## 2022-01-16 MED ORDER — BSS IO SOLN
INTRAOCULAR | Status: DC | PRN
  Administered 2022-01-16: 15 mL

## 2022-01-16 MED ORDER — ATROPINE SULFATE 1 % OP SOLN
OPHTHALMIC | Status: AC
  Filled 2022-01-16: qty 5

## 2022-01-16 MED ORDER — MIDAZOLAM HCL 2 MG/2ML IJ SOLN
INTRAMUSCULAR | Status: AC
  Filled 2022-01-16: qty 2

## 2022-01-16 MED ORDER — FENTANYL CITRATE (PF) 250 MCG/5ML IJ SOLN
INTRAMUSCULAR | Status: AC
  Filled 2022-01-16: qty 5

## 2022-01-16 MED ORDER — BRILLIANT BLUE G 0.025 % IO SOSY
PREFILLED_SYRINGE | INTRAOCULAR | Status: DC | PRN
  Administered 2022-01-16: 0.5 mL via INTRAVITREAL

## 2022-01-16 SURGICAL SUPPLY — 42 items
APPLICATOR COTTON TIP 6 STRL (MISCELLANEOUS) ×1 IMPLANT
APPLICATOR COTTON TIP 6IN STRL (MISCELLANEOUS) ×2
BAND WRIST GAS GREEN (MISCELLANEOUS) IMPLANT
CANNULA VLV SOFT TIP 25G (OPHTHALMIC) ×1 IMPLANT
CANNULA VLV SOFT TIP 25GA (OPHTHALMIC) ×2 IMPLANT
CLSR STERI-STRIP ANTIMIC 1/2X4 (GAUZE/BANDAGES/DRESSINGS) ×2 IMPLANT
DRAPE HALF SHEET 40X57 (DRAPES) ×2 IMPLANT
DRAPE RETRACTOR (MISCELLANEOUS) ×2 IMPLANT
FORCEPS GRIESHABER ILM 25G A (INSTRUMENTS) ×1 IMPLANT
GAS AUTO FILL CONSTEL (OPHTHALMIC) ×2
GAS AUTO FILL CONSTELLATION (OPHTHALMIC) IMPLANT
GAS WRIST BAND GREEN (MISCELLANEOUS)
GLOVE SURG SYN 7.5  E (GLOVE) ×1
GLOVE SURG SYN 7.5 E (GLOVE) ×1 IMPLANT
GLOVE SURG SYN 7.5 PF PI (GLOVE) ×1 IMPLANT
GOWN STRL REUS W/ TWL LRG LVL3 (GOWN DISPOSABLE) ×1 IMPLANT
GOWN STRL REUS W/TWL LRG LVL3 (GOWN DISPOSABLE) ×4
KIT BASIN OR (CUSTOM PROCEDURE TRAY) ×2 IMPLANT
KIT TURNOVER KIT B (KITS) ×2 IMPLANT
LENS BIOM SUPER VIEW SET DISP (MISCELLANEOUS) ×2 IMPLANT
NDL 18GX1X1/2 (RX/OR ONLY) (NEEDLE) ×1 IMPLANT
NDL 25GX 5/8IN NON SAFETY (NEEDLE) ×1 IMPLANT
NDL FILTER BLUNT 18X1 1/2 (NEEDLE) ×1 IMPLANT
NDL RETROBULBAR 25GX1.5 (NEEDLE) ×1 IMPLANT
NEEDLE 18GX1X1/2 (RX/OR ONLY) (NEEDLE) ×2 IMPLANT
NEEDLE 25GX 5/8IN NON SAFETY (NEEDLE) ×2 IMPLANT
NEEDLE FILTER BLUNT 18X 1/2SAF (NEEDLE) ×1
NEEDLE FILTER BLUNT 18X1 1/2 (NEEDLE) ×1 IMPLANT
NEEDLE RETROBULBAR 25GX1.5 (NEEDLE) ×2 IMPLANT
NS IRRIG 1000ML POUR BTL (IV SOLUTION) ×2 IMPLANT
PACK VITRECTOMY CUSTOM (CUSTOM PROCEDURE TRAY) ×2 IMPLANT
PAD ARMBOARD 7.5X6 YLW CONV (MISCELLANEOUS) ×3 IMPLANT
PAK PIK VITRECTOMY CVS 25GA (OPHTHALMIC) ×2 IMPLANT
PROBE ENDO DIATHERMY 25G (MISCELLANEOUS) ×1 IMPLANT
PROBE LASER ILLUM FLEX CVD 25G (OPHTHALMIC) ×1 IMPLANT
SCRAPER DIAMOND 25GA (OPHTHALMIC RELATED) ×1 IMPLANT
SOL ANTI FOG 6CC (MISCELLANEOUS) ×1 IMPLANT
SOLUTION ANTI FOG 6CC (MISCELLANEOUS) ×1
SUT VICRYL 7 0 TG140 8 (SUTURE) ×1 IMPLANT
SUT VICRYL 8 0 TG140 8 (SUTURE) IMPLANT
SYR TB 1ML LUER SLIP (SYRINGE) ×1 IMPLANT
WATER STERILE IRR 1000ML POUR (IV SOLUTION) ×2 IMPLANT

## 2022-01-16 NOTE — Brief Op Note (Signed)
01/16/2022  5:01 PM  PATIENT:  [name]  61 y.o. female  PRE-OPERATIVE DIAGNOSIS:  MACULAR HOLE RIGHT EYE  POST-OPERATIVE DIAGNOSIS:   MACULAR HOLE RIGHT EYE  PROCEDURE:  Procedure(s) with comments: PARS PLANA VITRECTOMY WITH 25 GAUGE (Right) INTERNAL LIMITING MEMBRANE PEEL (Right) GAS/FLUID EXCHANGE (Right) - C3F8 PHOTOCOAGULATION WITH LASER (Right)  SURGEON:  Surgeon(s) and Role:    * Jalene Mullet, MD - Primary  PHYSICIAN ASSISTANT:   ASSISTANTS: none   ANESTHESIA:   local and MAC  EBL:  MINIMAL   BLOOD ADMINISTERED:none  DRAINS: none   LOCAL MEDICATIONS USED:  MARCAINE    and LIDOCAINE   SPECIMEN:  No Specimen  DISPOSITION OF SPECIMEN:  N/A  COUNTS:  YES  TOURNIQUET:  * No tourniquets in log *  DICTATION: .Note written in EPIC  PLAN OF CARE: Discharge to home after PACU  PATIENT DISPOSITION:  PACU - hemodynamically stable.   Delay start of Pharmacological VTE agent (>24hrs) due to surgical blood loss or risk of bleeding: not applicable

## 2022-01-16 NOTE — Op Note (Incomplete)
[name] 01/16/2022 Diagnosis: Macular hole right eye  Procedure: Pars Plana Vitrectomy, Membrane Peeling, Endolaser, and Fluid Gas Exchange Operative Eye:  right eye  Surgeon: Harrold Donath Estimated Blood Loss: minimal Specimens for Pathology:  None Complications: none   The  patient was prepped and draped in the usual fashion for ocular surgery on the  right eye .  A lid speculum was placed.  Infusion line and trocar was placed at the 8 o'clock position approximately 3.5 mm from the surgical limbus.   The infusion line was allowed to run and then clamped when placed at the cannula opening. The line was inserted and secured to the drape with an adhesive strip.   Active trocars/cannula were placed at the 10 and 2 o'clock positions approximately 3.5 mm from the surgical limbus. The cannula was visualized in the vitreous cavity.  The light pipe and vitreous cutter were inserted into the vitreous cavity and a core vitrectomy was performed.  Care taken to remove the vitreous up to the vitreous base for 360 degrees.   Membrane blue was injected over the macula and irrigated free after 60 seconds.  The tano scratcher was used to elevate the internal limiting membrane (ILM) and the ILM forceps used to remove the ILM in surrounding the macular hole   Endolaser was applied the temporal tear.  The superior cannulas were sequentially removed with concommitant tamponade using a cotton tipped applicator and noted to be air tight.  The infusion line and trocar were removed and the sclerotomy was noted to be air tight with normal intraocular pressure by digital palpapation.  Subconjunctival injections of  Dexamethasone 4mg /26ml was placed in the infero-medial quadrant.   The speculum and drapes were removed and the eye was patched with Polymixin/Bacitracin ophthalmic ointment. An eye shield was placed and the patient was transferred alert and conversant with stable vital signs to the post operative  recovery area.  The patient tolerated the procedure well and no complications were noted.  0m MD

## 2022-01-16 NOTE — H&P (Signed)
Date of examination:  01/16/2022  Indication for surgery: macula hole right eye  Pertinent past medical history:  Past Medical History:  Diagnosis Date   Acid reflux    Arthritis    Complication of anesthesia    Hyperlipidemia    Hypertension    Neuropathy    PONV (postoperative nausea and vomiting)     Pertinent ocular history:  macular hole right eye  Pertinent family history:  Family History  Problem Relation Age of Onset   Heart attack Father     General:  Healthy appearing patient in no distress.    Eyes:    Acuity OD 20/200  External: Within normal limits     Anterior segment: Within normal limits       Fundus: macular hole right eye   Impression:macular hole right eye  Plan: macular hole repair right eye  Carmela Rima, MD

## 2022-01-16 NOTE — OR Nursing (Signed)
Green Gas bubble arm bracelet applied to patient right wrist

## 2022-01-16 NOTE — Discharge Instructions (Addendum)
DO NOT SLEEP ON BACK, THE EYE PRESSURE CAN GO UP AND CAUSE VISION LOSS   SLEEP ON SIDE WITH NOSE TO PILLOW  DURING DAY KEEP FACE DOWN.  15 MINUTES EVERY 2 HOURS IT IS OK TO LOOK STRAIGHT AHEAD (USE BATHROOM, EAT, WALK, ETC.)  

## 2022-01-16 NOTE — Transfer of Care (Signed)
Immediate Anesthesia Transfer of Care Note  Patient: [name]  Procedure(s) Performed: PARS PLANA VITRECTOMY WITH 25 GAUGE (Right: Eye) MEMBRANE PEEL (Right: Eye) GAS/FLUID EXCHANGE (Right: Eye) PHOTOCOAGULATION WITH LASER (Right: Eye)  Patient Location: PACU  Anesthesia Type:MAC  Level of Consciousness: awake, alert  and oriented  Airway & Oxygen Therapy: Patient Spontanous Breathing  Post-op Assessment: Report given to RN and Post -op Vital signs reviewed and stable  Post vital signs: Reviewed and stable  Last Vitals:  Vitals Value Taken Time  BP 151/83 01/16/22 1709  Temp 36.9 C 01/16/22 1708  Pulse 68 01/16/22 1712  Resp 13 01/16/22 1712  SpO2 97 % 01/16/22 1712  Vitals shown include unvalidated device data.  Last Pain:  Vitals:   01/16/22 1708  PainSc: 0-No pain         Complications: No notable events documented.

## 2022-01-16 NOTE — Anesthesia Preprocedure Evaluation (Addendum)
Anesthesia Evaluation  Patient identified by MRN, date of birth, ID band Patient awake    Reviewed: Allergy & Precautions, NPO status , Patient's Chart, lab work & pertinent test results  Airway Mallampati: II  TM Distance: >3 FB Neck ROM: Full    Dental no notable dental hx.    Pulmonary neg pulmonary ROS,    Pulmonary exam normal breath sounds clear to auscultation       Cardiovascular hypertension, Pt. on medications Normal cardiovascular exam Rhythm:Regular Rate:Normal     Neuro/Psych negative neurological ROS  negative psych ROS   GI/Hepatic Neg liver ROS, GERD  Medicated and Controlled,  Endo/Other  Morbid obesity  Renal/GU negative Renal ROS     Musculoskeletal  (+) Arthritis ,   Abdominal (+) + obese,   Peds  Hematology negative hematology ROS (+)   Anesthesia Other Findings MACULAR CYST, HOLE, OR PSEUDOHOLE RIGHT EYE  Reproductive/Obstetrics                            Anesthesia Physical Anesthesia Plan  ASA: 3  Anesthesia Plan: MAC   Post-op Pain Management:    Induction: Intravenous  PONV Risk Score and Plan: 2 and Ondansetron, Dexamethasone and Treatment may vary due to age or medical condition  Airway Management Planned: Nasal Cannula  Additional Equipment:   Intra-op Plan:   Post-operative Plan:   Informed Consent: I have reviewed the patients History and Physical, chart, labs and discussed the procedure including the risks, benefits and alternatives for the proposed anesthesia with the patient or authorized representative who has indicated his/her understanding and acceptance.     Dental advisory given  Plan Discussed with: CRNA  Anesthesia Plan Comments:        Anesthesia Quick Evaluation

## 2022-01-16 NOTE — Anesthesia Postprocedure Evaluation (Signed)
Anesthesia Post Note  Patient: [name]  Procedure(s) Performed: PARS PLANA VITRECTOMY WITH 25 GAUGE (Right: Eye) MEMBRANE PEEL (Right: Eye) GAS/FLUID EXCHANGE (Right: Eye) PHOTOCOAGULATION WITH LASER (Right: Eye)     Patient location during evaluation: PACU Anesthesia Type: MAC Level of consciousness: awake and alert, awake and oriented Pain management: pain level controlled Vital Signs Assessment: post-procedure vital signs reviewed and stable Respiratory status: spontaneous breathing, nonlabored ventilation and respiratory function stable Cardiovascular status: stable and blood pressure returned to baseline Postop Assessment: no apparent nausea or vomiting Anesthetic complications: no   No notable events documented.  Last Vitals:  Vitals:   01/16/22 1342 01/16/22 1708  BP: (!) 176/71 (!) 151/83  Pulse:  73  Resp:  11  Temp:  36.9 C  SpO2:  99%    Last Pain:  Vitals:   01/16/22 1708  PainSc: 0-No pain                 Santa Lighter

## 2022-01-17 ENCOUNTER — Encounter (HOSPITAL_COMMUNITY): Payer: Self-pay | Admitting: Ophthalmology

## 2022-02-22 DIAGNOSIS — H53411 Scotoma involving central area, right eye: Secondary | ICD-10-CM | POA: Diagnosis not present

## 2022-02-22 DIAGNOSIS — H40052 Ocular hypertension, left eye: Secondary | ICD-10-CM | POA: Diagnosis not present

## 2022-02-22 DIAGNOSIS — H35721 Serous detachment of retinal pigment epithelium, right eye: Secondary | ICD-10-CM | POA: Diagnosis not present

## 2022-02-22 DIAGNOSIS — H35351 Cystoid macular degeneration, right eye: Secondary | ICD-10-CM | POA: Diagnosis not present

## 2022-02-27 NOTE — Op Note (Signed)
[name] 01/16/2022 Diagnosis: Macular hole right eye  Procedure: Pars Plana Vitrectomy, Membrane Peeling, Fluid Gas Exchange, and C3F8 gas tamponade Operative Eye:  right eye  Surgeon: Harrold Donath Estimated Blood Loss: minimal Specimens for Pathology:  None Complications: none   The  patient was prepped and draped in the usual fashion for ocular surgery on the  right eye .  A lid speculum was placed.  Infusion line and trocar was placed at the 8 o'clock position approximately 3.5 mm from the surgical limbus.   The infusion line was allowed to run and then clamped when placed at the cannula opening. The line was inserted and secured to the drape with an adhesive strip.   Active trocars/cannula were placed at the 10 and 2 o'clock positions approximately 3.5 mm from the surgical limbus. The cannula was visualized in the vitreous cavity.  The light pipe and vitreous cutter were inserted into the vitreous cavity and a core vitrectomy was performed.  Care taken to remove the vitreous up to the vitreous base for 360 degrees.  Membrane blue was placed over the macula and irrigated free after 60 seconds.  The Tano scratcher was used to elevate the internal limiting membrane (ILM) and the ILM forceps were used to remove the ILM surrounding the macular hole.   3 rows of endolaser were applied 360 degrees to the periphery.  A complete air-fluid exchange was performed.  14% C3F8 gas was placed in the eye.  The superior cannulas were sequentially removed with concommitant tamponade using a cotton tipped applicator and noted to be air tight.  The infusion line and trocar were removed and the sclerotomy was noted to be air tight with normal intraocular pressure by digital palpapation.  Subconjunctival injections of  Dexamethasone 4mg /57ml was placed in the infero-medial quadrant.   The speculum and drapes were removed and the eye was patched with Polymixin/Bacitracin ophthalmic ointment. An eye  shield was placed and the patient was transferred alert and conversant with stable vital signs to the post operative recovery area.  The patient tolerated the procedure well and no complications were noted.  0m MD

## 2022-04-05 DIAGNOSIS — H35411 Lattice degeneration of retina, right eye: Secondary | ICD-10-CM | POA: Diagnosis not present

## 2022-04-05 DIAGNOSIS — H35721 Serous detachment of retinal pigment epithelium, right eye: Secondary | ICD-10-CM | POA: Diagnosis not present

## 2022-04-05 DIAGNOSIS — H35371 Puckering of macula, right eye: Secondary | ICD-10-CM | POA: Diagnosis not present

## 2022-04-05 DIAGNOSIS — H53411 Scotoma involving central area, right eye: Secondary | ICD-10-CM | POA: Diagnosis not present

## 2022-04-05 DIAGNOSIS — H33321 Round hole, right eye: Secondary | ICD-10-CM | POA: Diagnosis not present

## 2022-05-10 DIAGNOSIS — H35411 Lattice degeneration of retina, right eye: Secondary | ICD-10-CM | POA: Diagnosis not present

## 2022-05-10 DIAGNOSIS — H21341 Primary cyst of pars plana, right eye: Secondary | ICD-10-CM | POA: Diagnosis not present

## 2022-05-10 DIAGNOSIS — H33321 Round hole, right eye: Secondary | ICD-10-CM | POA: Diagnosis not present

## 2022-05-10 DIAGNOSIS — H25813 Combined forms of age-related cataract, bilateral: Secondary | ICD-10-CM | POA: Diagnosis not present

## 2022-05-10 DIAGNOSIS — H35721 Serous detachment of retinal pigment epithelium, right eye: Secondary | ICD-10-CM | POA: Diagnosis not present

## 2022-05-10 DIAGNOSIS — H35341 Macular cyst, hole, or pseudohole, right eye: Secondary | ICD-10-CM | POA: Diagnosis not present

## 2022-05-24 ENCOUNTER — Other Ambulatory Visit: Payer: Self-pay | Admitting: Physician Assistant

## 2022-05-24 ENCOUNTER — Other Ambulatory Visit (HOSPITAL_COMMUNITY): Payer: Self-pay | Admitting: Physician Assistant

## 2022-05-24 DIAGNOSIS — M2550 Pain in unspecified joint: Secondary | ICD-10-CM | POA: Diagnosis not present

## 2022-05-24 DIAGNOSIS — I10 Essential (primary) hypertension: Secondary | ICD-10-CM | POA: Diagnosis not present

## 2022-05-24 DIAGNOSIS — M79604 Pain in right leg: Secondary | ICD-10-CM

## 2022-05-24 DIAGNOSIS — Z6841 Body Mass Index (BMI) 40.0 and over, adult: Secondary | ICD-10-CM | POA: Diagnosis not present

## 2022-05-30 ENCOUNTER — Ambulatory Visit (HOSPITAL_COMMUNITY)
Admission: RE | Admit: 2022-05-30 | Discharge: 2022-05-30 | Disposition: A | Discharge status: Home / Self Care | Payer: 59 | Source: Ambulatory Visit | Attending: Physician Assistant | Admitting: Physician Assistant

## 2022-05-30 DIAGNOSIS — M79604 Pain in right leg: Secondary | ICD-10-CM | POA: Insufficient documentation

## 2022-05-30 DIAGNOSIS — M79661 Pain in right lower leg: Secondary | ICD-10-CM | POA: Diagnosis not present

## 2022-05-30 DIAGNOSIS — M79605 Pain in left leg: Secondary | ICD-10-CM | POA: Diagnosis not present

## 2022-05-30 DIAGNOSIS — M79662 Pain in left lower leg: Secondary | ICD-10-CM | POA: Diagnosis not present

## 2022-06-06 ENCOUNTER — Encounter: Payer: Self-pay | Admitting: Neurology

## 2022-06-11 DIAGNOSIS — M1711 Unilateral primary osteoarthritis, right knee: Secondary | ICD-10-CM | POA: Diagnosis not present

## 2022-06-11 DIAGNOSIS — M25561 Pain in right knee: Secondary | ICD-10-CM | POA: Diagnosis not present

## 2022-07-11 DIAGNOSIS — H33321 Round hole, right eye: Secondary | ICD-10-CM | POA: Diagnosis not present

## 2022-07-23 DIAGNOSIS — E7849 Other hyperlipidemia: Secondary | ICD-10-CM | POA: Diagnosis not present

## 2022-07-23 DIAGNOSIS — E782 Mixed hyperlipidemia: Secondary | ICD-10-CM | POA: Diagnosis not present

## 2022-07-27 DIAGNOSIS — M1711 Unilateral primary osteoarthritis, right knee: Secondary | ICD-10-CM | POA: Diagnosis not present

## 2022-07-27 DIAGNOSIS — M25562 Pain in left knee: Secondary | ICD-10-CM | POA: Diagnosis not present

## 2022-08-07 DIAGNOSIS — Z1212 Encounter for screening for malignant neoplasm of rectum: Secondary | ICD-10-CM | POA: Diagnosis not present

## 2022-08-07 DIAGNOSIS — Z1211 Encounter for screening for malignant neoplasm of colon: Secondary | ICD-10-CM | POA: Diagnosis not present

## 2022-08-24 DIAGNOSIS — H35341 Macular cyst, hole, or pseudohole, right eye: Secondary | ICD-10-CM | POA: Diagnosis not present

## 2022-08-24 DIAGNOSIS — H21341 Primary cyst of pars plana, right eye: Secondary | ICD-10-CM | POA: Diagnosis not present

## 2022-08-24 DIAGNOSIS — H33321 Round hole, right eye: Secondary | ICD-10-CM | POA: Diagnosis not present

## 2022-08-24 DIAGNOSIS — H31091 Other chorioretinal scars, right eye: Secondary | ICD-10-CM | POA: Diagnosis not present

## 2022-08-24 DIAGNOSIS — H25811 Combined forms of age-related cataract, right eye: Secondary | ICD-10-CM | POA: Diagnosis not present

## 2022-08-31 DIAGNOSIS — M170 Bilateral primary osteoarthritis of knee: Secondary | ICD-10-CM | POA: Diagnosis not present

## 2022-09-04 NOTE — Progress Notes (Unsigned)
Initial neurology clinic note  SERVICE DATE: 09/05/22 SERVICE TIME: 9:30 am  Reason for Evaluation: Consultation requested by Lovey Newcomer, PA for an opinion regarding leg pain. My final recommendations will be communicated back to the requesting physician by way of shared medical record or letter to requesting physician via [place] mail.  HPI: This is [name], a 60 y.o. 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 The patient is accompanied by husband.  Patient started having cramps in her legs about 10 years ago. She saw her physician who told her she was having spasms. She would eat mustard and this would help. They will be from the legs down. They seldom go above the knee. Over the years, the cramps have gotten more intense and frequent. She has burning, itching, tightness, and get very cold, particularly at night. It prevents her from sleeping. Some days are better than others. It is always at a least a 4/10 and as bad as 9/10. After the spasms, she feels very weak, as if she ran a marathon. She denies falls and does not walk with assistive devices. Symptoms seem worse in her left leg compared to the right.  She also endorses cramps in her hands with tightness in the hands in the morning (which she attributes to arthritis). She has occasional numbness and tingling in the hands that come and go.   Patient was seen by Daria Pastures on 05/24/22 for bilateral foot pain which she described as tingling. In documentation, there was noted of a minimally elevated anti-CCP antibody.  Of note, patient was seen by [facility] (Dr. Marjory Lies) in 2014 for left leg burning, tingling, numbness, and cramps. 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 Patient was then referred to pain  management as gabapentin caused disorientation and had to stop. Patient also declined PT at that time. She ended up having surgery in 2017 at L4-5 when she states the disc was removed. Patient is going to ortho for OA. She may be having silicone but in her knees.  Patient saw rheumatology (outside our system, maybe in 2020, she does not remember the doctor's name) to evaluate for rheumatoid arthritis, which she was told she did not have. She may have had genetic testing which she was told that showed something that ran in her family.  The patient denies symptoms suggestive of oculobulbar weakness including diplopia, ptosis, dysphagia, poor saliva control, dysarthria/dysphonia, impaired mastication, facial weakness/droop.  There are no neuromuscular respiratory weakness symptoms, particularly orthopnea>dyspnea.   The patient does not report symptoms referable to autonomic dysfunction including impaired sweating, heat or cold intolerance, excessive mucosal dryness, gastroparetic early satiety, postprandial abdominal bloating, constipation, bowel or bladder dyscontrol, or syncope/presyncope/orthostatic intolerance.  The patient has not noticed any recent skin rashes nor does she report any constitutional symptoms like fever, night sweats, anorexia or unintentional weight loss.  EtOH use: None  Restrictive diet? No Family history of neuropathy/myopathy/NM disease? Mother has neuropathy (unknown cause), maternal grandfather had neuropathy, father had muscle cramping, sister with neuropathy  She has never had an EMG.  Current medications being tried for the patient's symptoms: none  Prior medications that have been tried: gabapentin (stopped because inside of mouth swelled up)  Of note, patient mentions vertigo and hearing loss a couple of years ago  that required hospitalization. This has resolved after learning exercises (sounds like BPPV per her description).  Patient thinks she has nerve damage  from her back, but also thinks there is something hereditary (muscle disorder). She mentions that her relatives walk fun and are in pain.   MEDICATIONS:  Outpatient Encounter Medications as of 09/05/2022  Medication Sig   amLODipine (NORVASC) 5 MG tablet Take 5 mg by mouth daily.   Biotin w/ Vitamins C & E (HAIR/SKIN/NAILS PO) Take 1 tablet by mouth daily.   DULoxetine (CYMBALTA) 30 MG capsule Take 1 capsule (30 mg total) by mouth at bedtime for 7 days, THEN 2 capsules (60 mg total) at bedtime.   losartan (COZAAR) 100 MG tablet Take 100 mg by mouth daily.   meloxicam (MOBIC) 15 MG tablet Take 15 mg by mouth daily.   omeprazole (PRILOSEC) 40 MG capsule Take 40 mg by mouth daily.   OVER THE COUNTER MEDICATION Place 1 application into both ears daily as needed (Motion Ease).   RABEprazole (ACIPHEX) 20 MG tablet Take 20 mg by mouth daily.   No facility-administered encounter medications on file as of 09/05/2022.    PAST MEDICAL HISTORY: Past Medical History:  Diagnosis Date   Acid reflux    Arthritis    Cataract    Complication of anesthesia    Hyperlipidemia    Hypertension    Neuropathy    PONV (postoperative nausea and vomiting)     PAST SURGICAL HISTORY: Past Surgical History:  Procedure Laterality Date   GALLBLADDER SURGERY  1986   GAS/FLUID EXCHANGE Right 01/16/2022   Procedure: GAS/FLUID EXCHANGE;  Surgeon: Carmela Rima, MD;  Location: [facility] OR;  Service: Ophthalmology;  Laterality: Right;  C3F8   MEMBRANE PEEL Right 01/16/2022   Procedure: MEMBRANE PEEL;  Surgeon: Carmela Rima, MD;  Location: [facility] OR;  Service: Ophthalmology;  Laterality: Right;   PARS PLANA VITRECTOMY Right 01/16/2022   Procedure: PARS PLANA VITRECTOMY WITH 25 GAUGE;  Surgeon: Carmela Rima, MD;  Location: [facility] OR;  Service: Ophthalmology;  Laterality: Right;   PHOTOCOAGULATION WITH LASER Right 01/16/2022   Procedure: PHOTOCOAGULATION WITH LASER;  Surgeon: Carmela Rima, MD;  Location: [facility] OR;  Service:  Ophthalmology;  Laterality: Right;   TUBAL LIGATION      ALLERGIES: Allergies  Allergen Reactions   Gabapentin Hives and Swelling    Whole body    FAMILY HISTORY: Family History  Problem Relation Age of Onset   Heart attack Mother    Neuropathy Mother    Arthritis Mother    Heart attack Father     SOCIAL HISTORY: Social History   Tobacco Use   Smoking status: Never   Smokeless tobacco: Never  Vaping Use   Vaping Use: Never used  Substance Use Topics   Alcohol use: Never   Drug use: No   Social History   Social History Narrative   Patient lives at home with spouse.Caffeine Use: 2 cups daily      Right Handed    Lives in a one story home     OBJECTIVE: PHYSICAL EXAM: BP (!) 170/79   Pulse 72   Ht 5\' 5"  (1.651 m)   Wt 260 lb (117.9 kg)   SpO2 97%   BMI 43.27 kg/m   General: General appearance: Awake and alert. No distress. Cooperative with exam.  Skin: No obvious rash or jaundice. HEENT: Atraumatic. Anicteric. Lungs: Non-labored breathing on room air  Psych: Affect appropriate.  Neurological: Mental Status: Alert. Speech fluent. No pseudobulbar  affect Cranial Nerves: CNII: No RAPD. CNIII, IV, VI: PERRL. No nystagmus. EOMI. CN V: Facial sensation intact bilaterally to fine touch. CN VII: Facial muscles symmetric and strong. No ptosis at rest. CN VIII: Hears finger rub well bilaterally. CN IX: No hypophonia. CN X: Palate elevates symmetrically. CN XI: Full strength shoulder shrug bilaterally. CN XII: Tongue protrusion full and midline. No atrophy or fasciculations. No significant dysarthria Motor: Tone is normal. No fasciculations in extremities. No atrophy. No grip or percussive myotonia.  Individual muscle group testing (MRC grade out of 5):  Movement     Neck flexion 5    Neck extension 5     Right Left   Shoulder abduction 5 5   Shoulder adduction 5 5   Shoulder ext rotation 5 5   Shoulder int rotation 5 5   Elbow flexion 5 5    Elbow extension 5 5   Wrist extension 5 5   Wrist flexion 5 5   Finger abduction - FDI 5 5   Finger abduction - ADM 5 5   Finger extension 5 5   Finger distal flexion - 2/3 5 5    Finger distal flexion - 4/5 5 5    Thumb flexion - FPL 5 5   Thumb abduction - APB 5 5    Hip flexion 5 5   Hip extension 5 5   Hip adduction 5 5   Hip abduction 5 5   Knee extension 5 5   Knee flexion 5 5   Dorsiflexion 5 5   Plantarflexion 5 5   Inversion 5 5   Eversion 5 5   Great toe extension 5 5   Great toe flexion 5 5     Reflexes:  Right Left   Bicep 2+ 2+   Tricep 2+ 2+   BrRad 2+ 2+   Knee 2+ 2+   Ankle 2+ 0    Pathological Reflexes: Babinski: mute response bilaterally Hoffman: Absent in RUE, present in LUE Troemner: absent bilaterally  Sensation: Pinprick: Intact except, 50% of normal in left lateral leg and foot Vibration: 14 seconds in right great toe, 5 seconds in left great toe Temperature: Intact except, lateral aspect of left calf and foot 25% of normal Proprioception: intact in bilateral great toes Coordination: Intact finger-to- nose-finger bilaterally. Romberg negative. Gait: Able to rise from chair with arms crossed unassisted. Normal, narrow-based gait. Able to tandem walk. Able to walk on toes. Difficulty with heel walk on left.  Lab and Test Review: Internal labs: 01/16/22: BMP and CBC unremarkable  Bilateral lower extremity US (05/30/22): Normal bilateral resting ankle-brachial indices  MRI lumbar spine (08/26/13): FINDINGS:  On sagittal views the vertebral bodies have normal height and alignment,  except for 5mm retrolisthesis of L4 on L5 with posterior disc bulging.    Multi-level facet hypertrophy from L1-2 down to L5-S1. Degenerative  endplate disease at L5-S1 with fatty degeneration. The conus medullaris  terminates at the level of L1-2   On axial views:  L1-2: no spinal stenosis or foraminal narrowing  L2-3: no spinal stenosis or foraminal narrowing   L3-4: no spinal stenosis or foraminal narrowing  L4-5: no spinal stenosis or foraminal narrowing  L5-S1: disc bulging, facet hypertrophy with moderate-severe biforaminal  foraminal stenosis and bilateral lateral recess stenosis; potential  impingement upon the bilateral L5 and S1 roots   Limited views of the aorta, kidneys, iliopsoas muscles and sacroiliac  joints are unremarkable.     Impression  Abnormal MRI  lumbar spine (without) demonstrating:  1. At L5-S1: 71mm retrolisthesis of L4 on L5 with posterior disc bulging,  facet hypertrophy, resulting in moderate-severe biforaminal foraminal  stenosis and bilateral lateral recess stenosis, and potential impingement  upon the bilateral L5 and S1 roots  2. Multi-level facet hypertrophy from L1-2 down to L5-S1.     ASSESSMENT: [name] is a 61 y.o. female who presents for evaluation of leg burning and cramping. 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 Her neurological examination is pertinent for intact strength but asymmetric ankle jerks (absent on left) and sensory deficit on lateral left calf and foot. Available diagnostic data is significant for MRI lumbar spine from 2014 showing stenosis with moderate to severe foraminal stenosis with potential impingement at bilateral L5 and S1 nerve roots.   Overall, patient's symptoms of burning and cramps are neuropathic in origin. Given her asymmetric reflexes and sensory exam, her previous lumbar radiculopathy is certainly one component. It is also possible that she has a distal symmetric polyneuropathy, perhaps small fiber neuropathy. I will look for treatable causes of cramps and neuropathy and obtain an EMG with management as below.  PLAN: -Blood work: CK, B12, HbA1c, IFE, SPEP, ionized calcium, PTH, vit D, B6 -EMG: LLE > RLE (likely S1 radiculopathy but perhaps overlapping PN) -Given  over the counter recommendations for cramps and neuropathic pain. Patient will continue lidocaine cream as needed -Start Cymbalta 30 mg at night for 1 week, then 60 mg thereafter  -Return to clinic in 3 months  The impression above as well as the plan as outlined below were extensively discussed with the patient (in the company of husband) who voiced understanding. All questions were answered to their satisfaction.  The patient was counseled on pertinent fall precautions per the printed material provided today, and as noted under the "Patient Instructions" section below.  When available, results of the above investigations and possible further recommendations will be communicated to the patient via telephone/MyChart. Patient to call office if not contacted after expected testing turnaround time.   Total time spent reviewing records, interview, history/exam, documentation, and coordination of care on day of encounter:  65 min   Thank you for allowing me to participate in patient's care.  If I can answer any additional questions, I would be pleased to do so.  Jacquelyne Balint, MD   CC: Lovey Newcomer, PA [address] [place] [address]  CC: Referring provider: Lovey Newcomer, PA [address] [place],  [place] [address]

## 2022-09-05 ENCOUNTER — Ambulatory Visit (INDEPENDENT_AMBULATORY_CARE_PROVIDER_SITE_OTHER): Payer: 59 | Admitting: Neurology

## 2022-09-05 ENCOUNTER — Encounter: Payer: Self-pay | Admitting: Neurology

## 2022-09-05 ENCOUNTER — Other Ambulatory Visit (INDEPENDENT_AMBULATORY_CARE_PROVIDER_SITE_OTHER): Payer: 59

## 2022-09-05 VITALS — BP 170/79 | HR 72 | Ht 65.0 in | Wt 260.0 lb

## 2022-09-05 DIAGNOSIS — Z131 Encounter for screening for diabetes mellitus: Secondary | ICD-10-CM | POA: Diagnosis not present

## 2022-09-05 DIAGNOSIS — G629 Polyneuropathy, unspecified: Secondary | ICD-10-CM

## 2022-09-05 DIAGNOSIS — M5416 Radiculopathy, lumbar region: Secondary | ICD-10-CM | POA: Diagnosis not present

## 2022-09-05 DIAGNOSIS — R209 Unspecified disturbances of skin sensation: Secondary | ICD-10-CM

## 2022-09-05 LAB — VITAMIN D 25 HYDROXY (VIT D DEFICIENCY, FRACTURES): VITD: 16.53 ng/mL — ABNORMAL LOW (ref 30.00–100.00)

## 2022-09-05 LAB — VITAMIN B12: Vitamin B-12: 155 pg/mL — ABNORMAL LOW (ref 211–911)

## 2022-09-05 LAB — HEMOGLOBIN A1C: Hgb A1c MFr Bld: 6.1 % (ref 4.6–6.5)

## 2022-09-05 MED ORDER — DULOXETINE HCL 30 MG PO CPEP: ORAL_CAPSULE | ORAL | 0 refills | Status: DC

## 2022-09-05 NOTE — Patient Instructions (Addendum)
I think your cramping and burning pain in the legs is from nerve damage, likely from your back, but may also be a neuropathy.   I would like to do lab work tonight to look for potential causes.  I would like to do a muscle and nerve test called an EMG to make sure I understand the cause of your symptoms.  Treatment: -Will prescribe Cymbalta for nerve pain. You will take 1 tablet (30 mg) at bedtime for one week. If you are not having side effects, then increase to 2 tablets (60 mg) at bedtime.   I will be in contact when I have your results and want to see you in clinic again in 3 months.  Please let me know if you have any questions or concerns in the meantime.   - Recommend the following measures that may provide some symptomatic benefit for muscle twitching and/or cramps: - Adequate oral clear fluid intake to maintain optimal hydration (about 2.5 liters, or around 8-10 glasses per day) Avoidance of caffeine Trial of DIET tonic water: About 1 glass, up to 6 times daily Magnesium oxide up to 400 mg by mouth twice daily, as needed (over the counter) Gentle muscle stretching routine, especially before bedtime  Supplements that can be tried for "nerve" type pain: Alpha lipoic acid 641m daily: Has some research data but actual dose not well established as they used IV in the clinical research trials. No major side effects other than <1% of people report upset stomach. This can be taken twice per day (12064mdaily) if no relief obtained.  Acetyl-L-carnitine 100040m times daily: this has the most research data backing it with reports of diabetic, HIV and chemotherapy related neuropathy patients reporting improved symptoms. Well tolerated overall but can cause GI upset so take it with food.  Fish Oil (750m92mcosapentaenoic acid, 560mg36mosapentaenoic acid, 1020mg 71msahexaenoic acid) - animal models suggest it can improve neuropathy and potentially stimulate nerve growth. It has also been shown  to be beneficial in humans with type I diabetes and neuropathy.  Lidocaine cream - 1-2% can be applied to the feet/symptomatic areas several times daily. Wear gloves!  Capsaicin cream: Made of chili peppers, this cream burns and is actually rather painful when you first put it on. Mechanism of action is that it overwhelms all of the pain fibers, which theoretically lessens the pain. Wear gloves!  Curcumin - up to 600mg t60m times daily (most take ~1g daily) - somewhat expensive, but can be found on [employer] it with piperine 10-15mg/da51mn enhance absorption of this. Most supplements offer ~1800mg jus88m be taken daily. Animal models suggest it may help with neve pain and may also help with weight loss. Small human trials confirm an anti-inflammatory effect and effective arthritis pain relief. Caution if iron deficient as it might worsen this.  Vitamin E 10mg twic10mily  Vick's vapor rub  CBD topical cream or CBD oil. We cannot specifically recommend this given current federal laws, and there is no scientific data behind this at present to make a recommendation. But anecdotally people have said this can help.  Most of these supplements are cheapest if bought online.   Preventing Falls at Home  [facility], often dreaded events in the lives of older people. Aside from the obvious injuries and even death that may result, fall can cause wide-ranging consequences including loss of independence, mental decline, decreased activity and mobility. Younger people are also at risk of falling, especially  those with chronic illnesses and fatigue.  Ways to reduce risk for falling Examine diet and medications. Warm foods and alcohol dilate blood vessels, which can lead to dizziness when standing. Sleep aids, antidepressants and pain medications can also increase the likelihood of a fall.  Get a vision exam. Poor vision, cataracts and glaucoma increase the chances of falling.  Check foot  gear. Shoes should fit snugly and have a sturdy, nonskid sole and a broad, low heel  Participate in a physician-approved exercise program to build and maintain muscle strength and improve balance and coordination. Programs that use ankle weights or stretch bands are excellent for muscle-strengthening. Water aerobics programs and low-impact Tai Chi programs have also been shown to improve balance and coordination.  Increase vitamin D intake. Vitamin D improves muscle strength and increases the amount of calcium the body is able to absorb and deposit in bones.  How to prevent falls from common hazards Floors - Remove all loose wires, cords, and throw rugs. Minimize clutter. Make sure rugs are anchored and smooth. Keep furniture in its usual place.  Chairs -- Use chairs with straight backs, armrests and firm seats. Add firm cushions to existing pieces to add height.  Bathroom - Install grab bars and non-skid tape in the tub or shower. Use a bathtub transfer bench or a shower chair with a back support Use an elevated toilet seat and/or safety rails to assist standing from a low surface. Do not use towel racks or bathroom tissue holders to help you stand.  Lighting - Make sure halls, stairways, and entrances are well-lit. Install a night light in your bathroom or hallway. Make sure there is a light switch at the top and bottom of the staircase. Turn lights on if you get up in the middle of the night. Make sure lamps or light switches are within reach of the bed if you have to get up during the night.  Kitchen - Install non-skid rubber mats near the sink and stove. Clean spills immediately. Store frequently used utensils, pots, pans between waist and eye level. This helps prevent reaching and bending. Sit when getting things out of lower cupboards.  Living room/ Bedrooms - Place furniture with wide spaces in between, giving enough room to move around. Establish a route through the living room that gives  you something to hold onto as you walk.  Stairs - Make sure treads, rails, and rugs are secure. Install a rail on both sides of the stairs. If stairs are a threat, it might be helpful to arrange most of your activities on the lower level to reduce the number of times you must climb the stairs.  Entrances and doorways - Install metal handles on the walls adjacent to the doorknobs of all doors to make it more secure as you travel through the doorway.  Tips for maintaining balance Keep at least one hand free at all times. Try using a backpack or fanny pack to hold things rather than carrying them in your hands. Never carry objects in both hands when walking as this interferes with keeping your balance.  Attempt to swing both arms from front to back while walking. This might require a conscious effort if Parkinson's disease has diminished your movement. It will, however, help you to maintain balance and posture, and reduce fatigue.  Consciously lift your feet off of the ground when walking. Shuffling and dragging of the feet is a common culprit in losing your balance.  When trying to navigate  turns, use a "U" technique of facing forward and making a wide turn, rather than pivoting sharply.  Try to stand with your feet shoulder-length apart. When your feet are close together for any length of time, you increase your risk of losing your balance and falling.  Do one thing at a time. Don't try to walk and accomplish another task, such as reading or looking around. The decrease in your automatic reflexes complicates motor function, so the less distraction, the better.  Do not wear rubber or gripping soled shoes, they might "catch" on the floor and cause tripping.  Move slowly when changing positions. Use deliberate, concentrated movements and, if needed, use a grab bar or walking aid. Count 15 seconds between each movement. For example, when rising from a seated position, wait 15 seconds after standing  to begin walking.  If balance is a continuous problem, you might want to consider a walking aid such as a cane, walking stick, or walker. Once you've mastered walking with help, you might be ready to try it on your own again.   The physicians and staff at [facility] are committed to providing excellent care. You may receive a survey requesting feedback about your experience at our office. We strive to receive "very good" responses to the survey questions. If you feel that your experience would prevent you from giving the office a "very good " response, please contact our office to try to remedy the situation. We may be reached at [phone]. Thank you for taking the time out of your busy day to complete the survey.  Kai Levins, MD [facility]

## 2022-09-10 ENCOUNTER — Telehealth: Payer: Self-pay | Admitting: Neurology

## 2022-09-10 NOTE — Telephone Encounter (Signed)
Pt was put on Cymbalta 30mg , the patient is very nauseous and feels very high. She thinks its too powerful for her.

## 2022-09-10 NOTE — Telephone Encounter (Signed)
Returned patient's call. Cymbalta 30 mg at night has made her feel off and nauseated. Given that this dose is minimal, I advised that she stop it. She has also previously could not tolerate gabapentin. We discussed other options, but patient would like to hold off on further medication until after her EMG.  We also discussed her lab results. Her B12 and vit D were both low. She has bought supplementation as recommended and will start this. 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 I will recheck this at follow up.  All questions were answered.  Amber Balint, MD [facility]

## 2022-09-11 LAB — PROTEIN ELECTROPHORESIS, SERUM
Albumin ELP: 4.1 g/dL (ref 3.8–4.8)
Alpha 1: 0.3 g/dL (ref 0.2–0.3)
Alpha 2: 0.8 g/dL (ref 0.5–0.9)
Beta 2: 0.4 g/dL (ref 0.2–0.5)
Beta Globulin: 0.5 g/dL (ref 0.4–0.6)
Gamma Globulin: 0.8 g/dL (ref 0.8–1.7)
Total Protein: 6.9 g/dL (ref 6.1–8.1)

## 2022-09-11 LAB — CALCIUM, IONIZED: Calcium, Ion: 4.8 mg/dL (ref 4.7–5.5)

## 2022-09-11 LAB — PTH, INTACT AND CALCIUM
Calcium: 8.9 mg/dL (ref 8.6–10.4)
PTH: 77 pg/mL (ref 16–77)

## 2022-09-11 LAB — VITAMIN B6: Vitamin B6: 11.8 ng/mL (ref 2.1–21.7)

## 2022-09-11 LAB — IMMUNOFIXATION ELECTROPHORESIS
IgG (Immunoglobin G), Serum: 866 mg/dL (ref 600–1540)
IgM, Serum: 47 mg/dL — ABNORMAL LOW (ref 50–300)
Immunoglobulin A: 199 mg/dL (ref 70–320)

## 2022-09-24 ENCOUNTER — Ambulatory Visit: Payer: 59 | Admitting: Neurology

## 2022-09-24 ENCOUNTER — Encounter: Payer: Self-pay | Admitting: Neurology

## 2022-09-24 DIAGNOSIS — R209 Unspecified disturbances of skin sensation: Secondary | ICD-10-CM

## 2022-09-24 DIAGNOSIS — M5417 Radiculopathy, lumbosacral region: Secondary | ICD-10-CM

## 2022-09-24 DIAGNOSIS — G629 Polyneuropathy, unspecified: Secondary | ICD-10-CM

## 2022-09-24 NOTE — Procedures (Signed)
  [facility]  [place]  [place], [place] [address] Tel: [phone] Fax: [phone] Test Date:  09/24/2022  Patient: [name] DOB: March 11, 1961 Physician: Kai Levins, MD  Sex: Female Height: 5\' 5"  Ref Phys: Kai Levins, MD  ID#: [id number]   Technician:    History: This is a 61 year old female with leg pain in the setting of reduced sensation over left lateral lower leg and absent left ankle jerk.  NCV & EMG Findings: Extensive electrodiagnostic evaluation of the left lower limb shows: Left sural and superficial fibular sensory responses are within normal limits. Left fibular (EDB) and tibial (AH) motor responses are within normal limits. Left H Reflex is absent. Chronic motor axon loss changes without accompanying active denervation changes are seen in the left medial head of the gastrocnemius and left short head of the biceps femoris muscles.  Impression: This is an abnormal electrodiagnostic evaluation of the left lower limbs. 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 There is no electrodiagnostic evidence of a large fiber polyneuropathy.    ___________________________ Kai Levins, MD    Nerve Conduction Studies Motor Nerve Results    Latency Amplitude F-Lat Segment Distance CV Comment  Site (ms) Norm (mV) Norm (ms)  (cm) (m/s) Norm   Left Fibular (EDB) Motor  Ankle 3.4  < 6.0 5.0  > 2.5        Bel fib head 9.8 - 4.2 -  Bel fib head-Ankle 30 47  > 40   Pop fossa 11.7 - 4.0 -  Pop fossa-Bel fib head 9 47 -   Left Tibial (AH) Motor  Ankle 3.5  < 6.0 11.1  > 4.0        Knee 11.3 - 7.3 -  Knee-Ankle 42 54  > 40    Sensory Sites    Neg Peak Lat Amplitude (O-P) Segment Distance Velocity Comment  Site (ms) Norm (V) Norm  (cm) (ms)   Left Superficial Fibular Sensory  14 cm-Ankle 3.3  < 4.6 4  > 3 14  cm-Ankle 14    Left Sural Sensory  Calf-Lat mall 2.9  < 4.6 4  > 3 Calf-Lat mall 14     H-Reflex Results    M-Lat H Lat H Neg Amp H-M Lat  Site (ms) (ms) Norm (mV) (ms)  Left Tibial H-Reflex  Pop fossa 6.0 NR  < 35.0 NR -   Electromyography   Side Muscle Ins.Act Fibs Fasc Recrt Amp Dur Poly Activation Comment  Left Tib ant Nml Nml Nml Nml Nml Nml Nml Nml N/A  Left Gastroc MH Nml Nml Nml *1- *1+ *1+ *1+ Nml N/A  Left Vastus lat Nml Nml Nml Nml Nml Nml Nml Nml N/A  Left Biceps fem SH Nml Nml Nml *1- *1+ *1+ Nml Nml N/A  Left Gluteus med Nml Nml Nml Nml Nml Nml Nml Nml N/A      Waveforms:  Motor      Sensory      H-Reflex

## 2022-09-27 DIAGNOSIS — H25811 Combined forms of age-related cataract, right eye: Secondary | ICD-10-CM | POA: Diagnosis not present

## 2022-09-27 DIAGNOSIS — H2512 Age-related nuclear cataract, left eye: Secondary | ICD-10-CM | POA: Diagnosis not present

## 2022-10-03 DIAGNOSIS — Z23 Encounter for immunization: Secondary | ICD-10-CM | POA: Diagnosis not present

## 2022-10-31 HISTORY — PX: CATARACT EXTRACTION: SUR2

## 2022-11-09 DIAGNOSIS — H25811 Combined forms of age-related cataract, right eye: Secondary | ICD-10-CM | POA: Diagnosis not present

## 2022-11-26 DIAGNOSIS — H2512 Age-related nuclear cataract, left eye: Secondary | ICD-10-CM | POA: Diagnosis not present

## 2022-11-28 DIAGNOSIS — M170 Bilateral primary osteoarthritis of knee: Secondary | ICD-10-CM | POA: Diagnosis not present

## 2022-11-29 ENCOUNTER — Other Ambulatory Visit: Payer: Self-pay | Admitting: Neurology

## 2022-11-30 DIAGNOSIS — H25812 Combined forms of age-related cataract, left eye: Secondary | ICD-10-CM | POA: Diagnosis not present

## 2022-11-30 HISTORY — PX: CATARACT EXTRACTION: SUR2

## 2022-11-30 NOTE — Progress Notes (Unsigned)
I saw [name] in neurology clinic on 12/05/22 in follow up for leg pain.  HPI: [name] is a 61 y.o. 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  To briefly review: Patient started having cramps in her legs about 10 years ago. She saw her physician who told her she was having spasms. She would eat mustard and this would help. They will be from the legs down. They seldom go above the knee. Over the years, the cramps have gotten more intense and frequent. She has burning, itching, tightness, and get very cold, particularly at night. It prevents her from sleeping. Some days are better than others. It is always at a least a 4/10 and as bad as 9/10. After the spasms, she feels very weak, as if she ran a marathon. She denies falls and does not walk with assistive devices. Symptoms seem worse in her left leg compared to the right.   She also endorses cramps in her hands with tightness in the hands in the morning (which she attributes to arthritis). She has occasional numbness and tingling in the hands that come and go.    Patient was seen by Daria Pastures on 05/24/22 for bilateral foot pain which she described as tingling. In documentation, there was noted of a minimally elevated anti-CCP antibody.   Of note, patient was seen by [facility] (Dr. Marjory Lies) in 2014 for left leg burning, tingling, numbness, and cramps. 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 Patient was then referred to pain management as gabapentin caused disorientation and had to stop. Patient also declined PT at that time. She ended up having surgery in 2017 at L4-5 when she states the disc was removed. Patient is going to ortho for OA. She may be having silicone but in her knees.   Patient saw rheumatology (outside  our system, maybe in 2020, she does not remember the doctor's name) to evaluate for rheumatoid arthritis, which she was told she did not have. She may have had genetic testing which she was told that showed something that ran in her family.    The patient has not noticed any recent skin rashes nor does she report any constitutional symptoms like fever, night sweats, anorexia or unintentional weight loss.   EtOH use: None  Restrictive diet? No Family history of neuropathy/myopathy/NM disease? Mother has neuropathy (unknown cause), maternal grandfather had neuropathy, father had muscle cramping, sister with neuropathy    Prior medications that have been tried: gabapentin (stopped because inside of mouth swelled up), Cymbalta (stopped because it made her feel off and nauseated).   Of note, patient mentions vertigo and hearing loss a couple of years ago that required hospitalization. This has resolved after learning exercises (sounds like BPPV per her description).  09/05/22 Assessment: Overall, patient's symptoms of burning and cramps are neuropathic in origin. Given her asymmetric reflexes and sensory exam, her previous lumbar radiculopathy is certainly one component. It is also possible that she has a distal symmetric polyneuropathy, perhaps small fiber neuropathy. I will look for treatable causes of cramps and neuropathy and obtain an EMG with management as below.   PLAN: -Blood work: CK, B12, HbA1c, IFE, SPEP, ionized calcium, PTH, vit D, B6 -EMG: LLE > RLE (likely S1 radiculopathy but perhaps overlapping PN) -Given over the  counter recommendations for cramps and neuropathic pain. Patient will continue lidocaine cream as needed -Start Cymbalta 30 mg at night for 1 week, then 60 mg thereafter  Since their last visit: Patient's symptoms are about the same as prior. She continues to have burning in her feet. She goes to ortho and gets "gel put in her knees" for arthritis. She has 2 more  rounds.  Patient was unable to tolerate Cymbalta. It made her feel off and nauseated. She is no longer on Cymbalta. She is no longer taking lidocaine cream, but did get relief when she was using it.  Her labs were significant for low vit D and B12. I recommended supplementation on 09/05/22. Patient is taking B12 and vit D as recommendation. She has felt better in terms of energy and mood since starting supplementation.  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 HbA1c was 6.1. 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  Patient had cataract surgery on both eyes since last visit. She continues to see her retina specialist.   MEDICATIONS:  Outpatient Encounter Medications as of 12/05/2022  Medication Sig   amLODipine (NORVASC) 5 MG tablet Take 5 mg by mouth daily.   Biotin w/ Vitamins C & E (HAIR/SKIN/NAILS PO) Take 1 tablet by mouth daily.   ketorolac (ACULAR) 0.5 % ophthalmic solution INSTILL ONE DROP IN THE RIGHT EYE FOUR TIMES DAILY STARTING THE DAY BEFORE surgery   losartan (COZAAR) 100 MG tablet Take 100 mg by mouth daily.   meloxicam (MOBIC) 15 MG tablet Take 15 mg by mouth daily.   ofloxacin (OCUFLOX) 0.3 % ophthalmic solution INSTILL ONE DROP IN THE RIGHT EYE FOUR TIMES DAILY STARTING 1 DAY BEFORE surgery   omeprazole (PRILOSEC) 40 MG capsule Take 40 mg by mouth daily.   prednisoLONE acetate (PRED FORTE) 1 % ophthalmic suspension INSTILL ONE DROP IN THE RIGHT EYE FOUR TIMES DAILY a DAY AFTER surgery   RABEprazole (ACIPHEX) 20 MG tablet Take 20 mg by mouth daily.   OVER THE COUNTER MEDICATION Place 1 application into both ears daily as needed (Motion Ease). (Patient not taking: Reported on 12/05/2022)   No facility-administered encounter medications on file as of 12/05/2022.    PAST MEDICAL HISTORY: Past Medical History:  Diagnosis Date   Acid reflux    Arthritis    Cataract    Complication of anesthesia     Hyperlipidemia    Hypertension    Neuropathy    PONV (postoperative nausea and vomiting)     PAST SURGICAL HISTORY: Past Surgical History:  Procedure Laterality Date   GALLBLADDER SURGERY  1986   GAS/FLUID EXCHANGE Right 01/16/2022   Procedure: GAS/FLUID EXCHANGE;  Surgeon: Carmela RimaPatel, Narendra, MD;  [facility];  Service: Ophthalmology;  Laterality: Right;  [id number]   MEMBRANE PEEL Right 01/16/2022   Procedure: MEMBRANE PEEL;  Surgeon: Carmela RimaPatel, Narendra, MD;  [facility];  Service: Ophthalmology;  Laterality: Right;   PARS PLANA VITRECTOMY Right 01/16/2022   Procedure: PARS PLANA VITRECTOMY WITH 25 GAUGE;  Surgeon: Carmela RimaPatel, Narendra, MD;  [facility];  Service: Ophthalmology;  Laterality: Right;   PHOTOCOAGULATION WITH LASER Right 01/16/2022   Procedure: PHOTOCOAGULATION WITH LASER;  Surgeon: Carmela RimaPatel, Narendra, MD;  [facility];  Service: Ophthalmology;  Laterality: Right;   TUBAL LIGATION      ALLERGIES: Allergies  Allergen Reactions   Gabapentin Hives and Swelling    Whole body    FAMILY  HISTORY: Family History  Problem Relation Age of Onset   Heart attack Mother    Neuropathy Mother    Arthritis Mother    Heart attack Father     SOCIAL HISTORY: Social History   Tobacco Use   Smoking status: Never   Smokeless tobacco: Never  Vaping Use   Vaping Use: Never used  Substance Use Topics   Alcohol use: Never   Drug use: No   Social History   Social History Narrative   Patient lives at home with spouse.Caffeine Use: 2 cups daily   retired   Engineer, drilling    Lives in a one story home    Objective:  Vital Signs:  BP (!) 147/76   Pulse 72   Ht 5\' 5"  (1.651 m)   Wt 257 lb 9.6 oz (116.8 kg)   SpO2 95%   BMI 42.87 kg/m   General: General appearance: Awake and alert. No distress. Cooperative with exam. Skin: No rash or jaundice. HEENT: Atraumatic. Anicteric. Lungs: Non-labored breathing on room air   Neurological: Mental Status: Alert. Speech fluent. No  pseudobulbar affect Cranial Nerves: CNII: No RAPD. Visual fields intact. CNIII, IV, VI: PERRL. No nystagmus. EOMI. CN V: Facial sensation intact bilaterally to fine touch. CN VII: Facial muscles symmetric and strong. No ptosis at rest. CN VIII: Hears finger rub well bilaterally. CN IX: No hypophonia. CN X: Palate elevates symmetrically. CN XI: Full strength shoulder shrug bilaterally. CN XII: Tongue protrusion full and midline. No atrophy or fasciculations. No significant dysarthria Motor: Tone is normal. 5/5 in bilateral upper and lower extremities Reflexes:  Right Left  Bicep 2+ 2+  Tricep 2+ 2+  BrRad 2+ 2+  Knee 2+ 2+  Ankle 2+ 0   Sensation: Pinprick: Intact except: diminished in top and lateral aspect of left foot lateral aspect of left lower leg Coordination: Intact finger-to- nose-finger and heel-to-shin bilaterally. Gait: Normal, narrow-based gait.    Lab and Test Review: New results: 09/05/22 labs: Normal or unremarkable: ionized Ca, PTH, B6 IFE with faint IgA lambda band Vit D: low at 16.53 HbA1c: 6.1 B12: 155  EMG (09/24/22): NCV & EMG Findings: Extensive electrodiagnostic evaluation of the left lower limb shows: Left sural and superficial fibular sensory responses are within normal limits. Left fibular (EDB) and tibial (AH) motor responses are within normal limits. Left H Reflex is absent. Chronic motor axon loss changes without accompanying active denervation changes are seen in the left medial head of the gastrocnemius and left short head of the biceps femoris muscles.  Previously reviewed results: 01/16/22: BMP and CBC unremarkable   Bilateral lower extremity 01/18/22 (05/30/22): Normal bilateral resting ankle-brachial indices   MRI lumbar spine (08/26/13): FINDINGS:  On sagittal views the vertebral bodies have normal height and alignment,  except for 52mm retrolisthesis of L4 on L5 with posterior disc bulging.    Multi-level facet hypertrophy from L1-2 down  to L5-S1. Degenerative  endplate disease at L5-S1 with fatty degeneration. The conus medullaris  terminates at the level of L1-2   On axial views:  L1-2: no spinal stenosis or foraminal narrowing  L2-3: no spinal stenosis or foraminal narrowing  L3-4: no spinal stenosis or foraminal narrowing  L4-5: no spinal stenosis or foraminal narrowing  L5-S1: disc bulging, facet hypertrophy with moderate-severe biforaminal  foraminal stenosis and bilateral lateral recess stenosis; potential  impingement upon the bilateral L5 and S1 roots   Limited views of the aorta, kidneys, iliopsoas muscles and sacroiliac  joints are unremarkable.  Impression  Abnormal MRI lumbar spine (without) demonstrating:  1. At L5-S1: 59mm retrolisthesis of L4 on L5 with posterior disc bulging,  facet hypertrophy, resulting in moderate-severe biforaminal foraminal  stenosis and bilateral lateral recess stenosis, and potential impingement  upon the bilateral L5 and S1 roots  2. Multi-level facet hypertrophy from L1-2 down to L5-S1.      Impression: This is an abnormal electrodiagnostic evaluation of the left lower limbs. 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 There is no electrodiagnostic evidence of a large fiber polyneuropathy.  ASSESSMENT: This is [name], a 61 y.o. female with cramping, numbness, tingling, and burning in feet (Left > right). 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 This likely explains the asymmetry on exam. Patient may also have an overlapping small fiber neuropathy.  Plan: -Blood work: recheck IFE, B12, vit D, kappa/lambda light chains, B1 -Discussed skin biopsy to evaluate for small fiber neuropathy, but given it would not significantly change management, patient deferred -Continue vit D  and B12 supplementation -Continue lidocaine cream PRN  Return to clinic as needed  Total time spent reviewing records, interview, history/exam, documentation, and coordination of care on day of encounter:  30 min  Jacquelyne Balint, MD

## 2022-12-05 ENCOUNTER — Ambulatory Visit (INDEPENDENT_AMBULATORY_CARE_PROVIDER_SITE_OTHER): Payer: 59 | Admitting: Neurology

## 2022-12-05 ENCOUNTER — Other Ambulatory Visit (INDEPENDENT_AMBULATORY_CARE_PROVIDER_SITE_OTHER): Payer: 59

## 2022-12-05 ENCOUNTER — Encounter: Payer: Self-pay | Admitting: Neurology

## 2022-12-05 VITALS — BP 147/76 | HR 72 | Ht 65.0 in | Wt 257.6 lb

## 2022-12-05 DIAGNOSIS — R209 Unspecified disturbances of skin sensation: Secondary | ICD-10-CM

## 2022-12-05 DIAGNOSIS — E559 Vitamin D deficiency, unspecified: Secondary | ICD-10-CM | POA: Diagnosis not present

## 2022-12-05 DIAGNOSIS — G629 Polyneuropathy, unspecified: Secondary | ICD-10-CM

## 2022-12-05 DIAGNOSIS — M5417 Radiculopathy, lumbosacral region: Secondary | ICD-10-CM | POA: Diagnosis not present

## 2022-12-05 DIAGNOSIS — E538 Deficiency of other specified B group vitamins: Secondary | ICD-10-CM

## 2022-12-05 DIAGNOSIS — M1990 Unspecified osteoarthritis, unspecified site: Secondary | ICD-10-CM | POA: Diagnosis not present

## 2022-12-05 LAB — VITAMIN B12: Vitamin B-12: 1293 pg/mL — ABNORMAL HIGH (ref 211–911)

## 2022-12-05 LAB — CK: Total CK: 117 U/L (ref 7–177)

## 2022-12-05 LAB — VITAMIN D 25 HYDROXY (VIT D DEFICIENCY, FRACTURES): VITD: 28.52 ng/mL — ABNORMAL LOW (ref 30.00–100.00)

## 2022-12-05 NOTE — Patient Instructions (Signed)
I would like recheck some labs today. I will be in touch when I have your results.  Continue the vit D and B12 supplements (1000 daily).  Continue lidocaine cream as needed for pain.  Follow up with me as needed.  The physicians and staff at [facility] are committed to providing excellent care. You may receive a survey requesting feedback about your experience at our office. We strive to receive "very good" responses to the survey questions. If you feel that your experience would prevent you from giving the office a "very good " response, please contact our office to try to remedy the situation. We may be reached at [phone]. Thank you for taking the time out of your busy day to complete the survey.  Jacquelyne Balint, MD [facility]

## 2022-12-07 DIAGNOSIS — M170 Bilateral primary osteoarthritis of knee: Secondary | ICD-10-CM | POA: Diagnosis not present

## 2022-12-13 LAB — KAPPA/LAMBDA LIGHT CHAINS
Kappa free light chain: 27.2 mg/L — ABNORMAL HIGH (ref 3.3–19.4)
Kappa:Lambda Ratio: 1.53 (ref 0.26–1.65)
Lambda Free Lght Chn: 17.8 mg/L (ref 5.7–26.3)

## 2022-12-13 LAB — IMMUNOFIXATION ELECTROPHORESIS
IgG (Immunoglobin G), Serum: 889 mg/dL (ref 600–1540)
IgM, Serum: 52 mg/dL (ref 50–300)
Immunoglobulin A: 232 mg/dL (ref 70–320)

## 2022-12-13 LAB — VITAMIN B1: Vitamin B1 (Thiamine): 11 nmol/L (ref 8–30)

## 2022-12-14 DIAGNOSIS — H5315 Visual distortions of shape and size: Secondary | ICD-10-CM | POA: Diagnosis not present

## 2022-12-14 DIAGNOSIS — M170 Bilateral primary osteoarthritis of knee: Secondary | ICD-10-CM | POA: Diagnosis not present

## 2022-12-14 DIAGNOSIS — H43393 Other vitreous opacities, bilateral: Secondary | ICD-10-CM | POA: Diagnosis not present

## 2022-12-14 DIAGNOSIS — H43823 Vitreomacular adhesion, bilateral: Secondary | ICD-10-CM | POA: Diagnosis not present

## 2022-12-14 DIAGNOSIS — H35371 Puckering of macula, right eye: Secondary | ICD-10-CM | POA: Diagnosis not present

## 2022-12-14 DIAGNOSIS — Z961 Presence of intraocular lens: Secondary | ICD-10-CM | POA: Diagnosis not present

## 2023-01-09 ENCOUNTER — Telehealth: Payer: Self-pay | Admitting: Anesthesiology

## 2023-01-09 NOTE — Telephone Encounter (Signed)
Called Pt to get more information message left to call office back

## 2023-01-09 NOTE — Telephone Encounter (Signed)
Pt left message stating she would like her PCP Dr Nicki Reaper to get a copy of her recent lab results that were ordered by Dr Berdine Addison. Dr Lars Mage fax is [phone].

## 2023-01-25 DIAGNOSIS — M170 Bilateral primary osteoarthritis of knee: Secondary | ICD-10-CM | POA: Diagnosis not present

## 2023-02-07 DIAGNOSIS — I10 Essential (primary) hypertension: Secondary | ICD-10-CM | POA: Diagnosis not present

## 2023-02-07 DIAGNOSIS — Z6841 Body Mass Index (BMI) 40.0 and over, adult: Secondary | ICD-10-CM | POA: Diagnosis not present

## 2023-02-07 DIAGNOSIS — K219 Gastro-esophageal reflux disease without esophagitis: Secondary | ICD-10-CM | POA: Diagnosis not present

## 2023-02-07 DIAGNOSIS — E7849 Other hyperlipidemia: Secondary | ICD-10-CM | POA: Diagnosis not present

## 2023-04-12 DIAGNOSIS — H35371 Puckering of macula, right eye: Secondary | ICD-10-CM | POA: Diagnosis not present

## 2023-04-12 DIAGNOSIS — H40052 Ocular hypertension, left eye: Secondary | ICD-10-CM | POA: Diagnosis not present

## 2023-04-12 DIAGNOSIS — H43392 Other vitreous opacities, left eye: Secondary | ICD-10-CM | POA: Diagnosis not present

## 2023-04-12 DIAGNOSIS — Z961 Presence of intraocular lens: Secondary | ICD-10-CM | POA: Diagnosis not present

## 2023-04-12 DIAGNOSIS — H35341 Macular cyst, hole, or pseudohole, right eye: Secondary | ICD-10-CM | POA: Diagnosis not present

## 2023-04-14 DIAGNOSIS — Z791 Long term (current) use of non-steroidal anti-inflammatories (NSAID): Secondary | ICD-10-CM | POA: Diagnosis not present

## 2023-04-14 DIAGNOSIS — M1990 Unspecified osteoarthritis, unspecified site: Secondary | ICD-10-CM | POA: Diagnosis not present

## 2023-04-14 DIAGNOSIS — Z8249 Family history of ischemic heart disease and other diseases of the circulatory system: Secondary | ICD-10-CM | POA: Diagnosis not present

## 2023-04-14 DIAGNOSIS — Z882 Allergy status to sulfonamides status: Secondary | ICD-10-CM | POA: Diagnosis not present

## 2023-04-14 DIAGNOSIS — I10 Essential (primary) hypertension: Secondary | ICD-10-CM | POA: Diagnosis not present

## 2023-04-14 DIAGNOSIS — K219 Gastro-esophageal reflux disease without esophagitis: Secondary | ICD-10-CM | POA: Diagnosis not present

## 2023-04-14 DIAGNOSIS — Z6839 Body mass index (BMI) 39.0-39.9, adult: Secondary | ICD-10-CM | POA: Diagnosis not present

## 2023-04-14 DIAGNOSIS — G629 Polyneuropathy, unspecified: Secondary | ICD-10-CM | POA: Diagnosis not present

## 2023-04-25 DIAGNOSIS — H40052 Ocular hypertension, left eye: Secondary | ICD-10-CM | POA: Diagnosis not present

## 2023-08-19 DIAGNOSIS — M79672 Pain in left foot: Secondary | ICD-10-CM | POA: Diagnosis not present

## 2023-09-20 DIAGNOSIS — R7303 Prediabetes: Secondary | ICD-10-CM | POA: Diagnosis not present

## 2023-09-20 DIAGNOSIS — Z23 Encounter for immunization: Secondary | ICD-10-CM | POA: Diagnosis not present

## 2023-09-20 DIAGNOSIS — I10 Essential (primary) hypertension: Secondary | ICD-10-CM | POA: Diagnosis not present

## 2023-09-26 ENCOUNTER — Other Ambulatory Visit (HOSPITAL_COMMUNITY): Payer: Self-pay | Admitting: Physician Assistant

## 2023-09-26 DIAGNOSIS — I10 Essential (primary) hypertension: Secondary | ICD-10-CM | POA: Diagnosis not present

## 2023-09-26 DIAGNOSIS — Z1231 Encounter for screening mammogram for malignant neoplasm of breast: Secondary | ICD-10-CM

## 2023-09-26 DIAGNOSIS — E7849 Other hyperlipidemia: Secondary | ICD-10-CM | POA: Diagnosis not present

## 2023-09-26 DIAGNOSIS — Z1239 Encounter for other screening for malignant neoplasm of breast: Secondary | ICD-10-CM | POA: Diagnosis not present

## 2023-09-26 DIAGNOSIS — K219 Gastro-esophageal reflux disease without esophagitis: Secondary | ICD-10-CM | POA: Diagnosis not present

## 2023-09-26 DIAGNOSIS — Z6841 Body Mass Index (BMI) 40.0 and over, adult: Secondary | ICD-10-CM | POA: Diagnosis not present

## 2023-10-11 ENCOUNTER — Encounter (HOSPITAL_COMMUNITY): Payer: Self-pay

## 2023-10-11 ENCOUNTER — Ambulatory Visit (HOSPITAL_COMMUNITY)
Admission: RE | Admit: 2023-10-11 | Discharge: 2023-10-11 | Disposition: A | Discharge status: Home / Self Care | Payer: 59 | Source: Ambulatory Visit | Attending: Physician Assistant | Admitting: Physician Assistant

## 2023-10-11 DIAGNOSIS — Z1231 Encounter for screening mammogram for malignant neoplasm of breast: Secondary | ICD-10-CM | POA: Diagnosis not present

## 2024-03-27 DIAGNOSIS — F320 Major depressive disorder, single episode, mild: Secondary | ICD-10-CM | POA: Diagnosis not present

## 2024-03-27 DIAGNOSIS — R7303 Prediabetes: Secondary | ICD-10-CM | POA: Diagnosis not present

## 2024-03-27 DIAGNOSIS — G629 Polyneuropathy, unspecified: Secondary | ICD-10-CM | POA: Diagnosis not present

## 2024-03-27 DIAGNOSIS — K219 Gastro-esophageal reflux disease without esophagitis: Secondary | ICD-10-CM | POA: Diagnosis not present

## 2024-03-27 DIAGNOSIS — Z791 Long term (current) use of non-steroidal anti-inflammatories (NSAID): Secondary | ICD-10-CM | POA: Diagnosis not present

## 2024-03-27 DIAGNOSIS — M1990 Unspecified osteoarthritis, unspecified site: Secondary | ICD-10-CM | POA: Diagnosis not present

## 2024-03-27 DIAGNOSIS — I10 Essential (primary) hypertension: Secondary | ICD-10-CM | POA: Diagnosis not present

## 2024-03-27 DIAGNOSIS — K5900 Constipation, unspecified: Secondary | ICD-10-CM | POA: Diagnosis not present

## 2024-03-27 DIAGNOSIS — Z8249 Family history of ischemic heart disease and other diseases of the circulatory system: Secondary | ICD-10-CM | POA: Diagnosis not present

## 2024-03-27 DIAGNOSIS — Z882 Allergy status to sulfonamides status: Secondary | ICD-10-CM | POA: Diagnosis not present

## 2024-03-27 DIAGNOSIS — Z6841 Body Mass Index (BMI) 40.0 and over, adult: Secondary | ICD-10-CM | POA: Diagnosis not present

## 2024-07-29 DIAGNOSIS — H00021 Hordeolum internum right upper eyelid: Secondary | ICD-10-CM | POA: Diagnosis not present

## 2024-07-29 DIAGNOSIS — H40052 Ocular hypertension, left eye: Secondary | ICD-10-CM | POA: Diagnosis not present

## 2024-09-01 DIAGNOSIS — M79671 Pain in right foot: Secondary | ICD-10-CM | POA: Diagnosis not present

## 2024-09-01 DIAGNOSIS — M79672 Pain in left foot: Secondary | ICD-10-CM | POA: Diagnosis not present

## 2024-09-10 ENCOUNTER — Other Ambulatory Visit (HOSPITAL_COMMUNITY): Payer: Self-pay | Admitting: Physician Assistant

## 2024-09-10 DIAGNOSIS — Z1231 Encounter for screening mammogram for malignant neoplasm of breast: Secondary | ICD-10-CM

## 2024-10-14 ENCOUNTER — Inpatient Hospital Stay (HOSPITAL_COMMUNITY): Admission: RE | Admit: 2024-10-14 | Source: Ambulatory Visit

## 2024-11-11 ENCOUNTER — Ambulatory Visit (HOSPITAL_COMMUNITY): Payer: Self-pay

## 2024-12-14 ENCOUNTER — Ambulatory Visit (HOSPITAL_COMMUNITY): Payer: Self-pay
